# Patient Record
Sex: Female | Born: 1955
Health system: Southern US, Community
[De-identification: ages and names within clinical notes are randomized; demographics above are authoritative.]

## PROBLEM LIST (undated history)

## (undated) DIAGNOSIS — Z8619 Personal history of other infectious and parasitic diseases: Secondary | ICD-10-CM

## (undated) DIAGNOSIS — Z87448 Personal history of other diseases of urinary system: Secondary | ICD-10-CM

## (undated) DIAGNOSIS — F419 Anxiety disorder, unspecified: Secondary | ICD-10-CM

## (undated) DIAGNOSIS — M8580 Other specified disorders of bone density and structure, unspecified site: Secondary | ICD-10-CM

## (undated) DIAGNOSIS — I10 Essential (primary) hypertension: Secondary | ICD-10-CM

## (undated) HISTORY — PX: COLONOSCOPY: SHX174

## (undated) HISTORY — PX: WISDOM TOOTH EXTRACTION: SHX21

## (undated) HISTORY — DX: Essential (primary) hypertension: I10

## (undated) HISTORY — DX: Personal history of other diseases of urinary system: Z87.448

---
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
## 1999-02-01 ENCOUNTER — Encounter: Payer: Self-pay | Admitting: Orthopedic Surgery

## 1999-02-01 ENCOUNTER — Ambulatory Visit (HOSPITAL_COMMUNITY): Admission: RE | Admit: 1999-02-01 | Discharge: 1999-02-01 | Payer: Self-pay | Admitting: Orthopedic Surgery

## 1999-02-11 ENCOUNTER — Ambulatory Visit (HOSPITAL_COMMUNITY): Admission: RE | Admit: 1999-02-11 | Discharge: 1999-02-11 | Payer: Self-pay | Admitting: Orthopedic Surgery

## 1999-02-11 ENCOUNTER — Encounter: Payer: Self-pay | Admitting: Orthopedic Surgery

## 1999-10-14 ENCOUNTER — Encounter: Payer: Self-pay | Admitting: Obstetrics and Gynecology

## 1999-10-14 ENCOUNTER — Encounter: Admission: RE | Admit: 1999-10-14 | Discharge: 1999-10-14 | Payer: Self-pay | Admitting: Obstetrics and Gynecology

## 2000-10-22 ENCOUNTER — Encounter: Admission: RE | Admit: 2000-10-22 | Discharge: 2000-10-22 | Payer: Self-pay | Admitting: Obstetrics and Gynecology

## 2000-10-22 ENCOUNTER — Encounter: Payer: Self-pay | Admitting: Obstetrics and Gynecology

## 2001-10-28 ENCOUNTER — Encounter: Payer: Self-pay | Admitting: Obstetrics and Gynecology

## 2001-10-28 ENCOUNTER — Encounter: Admission: RE | Admit: 2001-10-28 | Discharge: 2001-10-28 | Payer: Self-pay | Admitting: Obstetrics and Gynecology

## 2002-11-03 ENCOUNTER — Encounter: Admission: RE | Admit: 2002-11-03 | Discharge: 2002-11-03 | Payer: Self-pay | Admitting: Obstetrics and Gynecology

## 2002-11-03 ENCOUNTER — Encounter: Payer: Self-pay | Admitting: Obstetrics and Gynecology

## 2003-05-21 ENCOUNTER — Encounter: Payer: Self-pay | Admitting: Internal Medicine

## 2003-05-21 ENCOUNTER — Ambulatory Visit (HOSPITAL_COMMUNITY): Admission: RE | Admit: 2003-05-21 | Discharge: 2003-05-21 | Payer: Self-pay | Admitting: Internal Medicine

## 2003-11-17 ENCOUNTER — Ambulatory Visit (HOSPITAL_COMMUNITY): Admission: RE | Admit: 2003-11-17 | Discharge: 2003-11-17 | Payer: Self-pay | Admitting: Obstetrics and Gynecology

## 2004-12-06 ENCOUNTER — Ambulatory Visit (HOSPITAL_COMMUNITY): Admission: RE | Admit: 2004-12-06 | Discharge: 2004-12-06 | Payer: Self-pay | Admitting: Obstetrics and Gynecology

## 2005-12-18 ENCOUNTER — Ambulatory Visit (HOSPITAL_COMMUNITY): Admission: RE | Admit: 2005-12-18 | Discharge: 2005-12-18 | Payer: Self-pay | Admitting: Obstetrics and Gynecology

## 2005-12-27 ENCOUNTER — Encounter: Admission: RE | Admit: 2005-12-27 | Discharge: 2005-12-27 | Payer: Self-pay | Admitting: Obstetrics and Gynecology

## 2007-01-02 ENCOUNTER — Ambulatory Visit (HOSPITAL_COMMUNITY): Admission: RE | Admit: 2007-01-02 | Discharge: 2007-01-02 | Payer: Self-pay | Admitting: Obstetrics and Gynecology

## 2008-01-07 ENCOUNTER — Ambulatory Visit (HOSPITAL_COMMUNITY): Admission: RE | Admit: 2008-01-07 | Discharge: 2008-01-07 | Payer: Self-pay | Admitting: Obstetrics and Gynecology

## 2008-05-08 ENCOUNTER — Ambulatory Visit: Payer: Self-pay | Admitting: Internal Medicine

## 2008-05-20 ENCOUNTER — Encounter: Payer: Self-pay | Admitting: Internal Medicine

## 2008-05-20 ENCOUNTER — Ambulatory Visit: Payer: Self-pay | Admitting: Internal Medicine

## 2008-05-21 ENCOUNTER — Encounter: Payer: Self-pay | Admitting: Internal Medicine

## 2009-01-12 ENCOUNTER — Ambulatory Visit (HOSPITAL_COMMUNITY): Admission: RE | Admit: 2009-01-12 | Discharge: 2009-01-12 | Payer: Self-pay | Admitting: Obstetrics & Gynecology

## 2010-01-17 ENCOUNTER — Encounter: Admission: RE | Admit: 2010-01-17 | Discharge: 2010-01-17 | Payer: Self-pay | Admitting: Obstetrics & Gynecology

## 2010-10-09 ENCOUNTER — Encounter: Payer: Self-pay | Admitting: Obstetrics and Gynecology

## 2010-12-13 ENCOUNTER — Other Ambulatory Visit: Payer: Self-pay | Admitting: Dermatology

## 2010-12-27 ENCOUNTER — Other Ambulatory Visit (HOSPITAL_COMMUNITY): Payer: Self-pay | Admitting: Obstetrics and Gynecology

## 2010-12-27 DIAGNOSIS — Z1231 Encounter for screening mammogram for malignant neoplasm of breast: Secondary | ICD-10-CM

## 2011-01-19 ENCOUNTER — Ambulatory Visit (HOSPITAL_COMMUNITY)
Admission: RE | Admit: 2011-01-19 | Discharge: 2011-01-19 | Disposition: A | Discharge status: Home / Self Care | Payer: 59 | Source: Ambulatory Visit | Attending: Obstetrics and Gynecology | Admitting: Obstetrics and Gynecology

## 2011-01-19 DIAGNOSIS — Z1231 Encounter for screening mammogram for malignant neoplasm of breast: Secondary | ICD-10-CM | POA: Insufficient documentation

## 2011-12-25 ENCOUNTER — Other Ambulatory Visit: Payer: Self-pay | Admitting: Obstetrics and Gynecology

## 2011-12-25 DIAGNOSIS — Z1231 Encounter for screening mammogram for malignant neoplasm of breast: Secondary | ICD-10-CM

## 2012-01-22 ENCOUNTER — Ambulatory Visit
Admission: RE | Admit: 2012-01-22 | Discharge: 2012-01-22 | Disposition: A | Discharge status: Home / Self Care | Payer: 59 | Source: Ambulatory Visit | Attending: Obstetrics and Gynecology | Admitting: Obstetrics and Gynecology

## 2012-01-22 DIAGNOSIS — Z1231 Encounter for screening mammogram for malignant neoplasm of breast: Secondary | ICD-10-CM

## 2012-01-25 ENCOUNTER — Other Ambulatory Visit: Payer: Self-pay | Admitting: Obstetrics and Gynecology

## 2012-01-25 DIAGNOSIS — R928 Other abnormal and inconclusive findings on diagnostic imaging of breast: Secondary | ICD-10-CM

## 2012-01-30 ENCOUNTER — Ambulatory Visit
Admission: RE | Admit: 2012-01-30 | Discharge: 2012-01-30 | Disposition: A | Discharge status: Home / Self Care | Payer: 59 | Source: Ambulatory Visit | Attending: Obstetrics and Gynecology | Admitting: Obstetrics and Gynecology

## 2012-01-30 DIAGNOSIS — R928 Other abnormal and inconclusive findings on diagnostic imaging of breast: Secondary | ICD-10-CM

## 2012-07-01 ENCOUNTER — Other Ambulatory Visit: Payer: Self-pay | Admitting: Obstetrics and Gynecology

## 2012-07-01 DIAGNOSIS — R922 Inconclusive mammogram: Secondary | ICD-10-CM

## 2012-07-02 ENCOUNTER — Ambulatory Visit
Admission: RE | Admit: 2012-07-02 | Discharge: 2012-07-02 | Disposition: A | Discharge status: Home / Self Care | Payer: 59 | Source: Ambulatory Visit | Attending: Obstetrics and Gynecology | Admitting: Obstetrics and Gynecology

## 2012-07-02 DIAGNOSIS — R922 Inconclusive mammogram: Secondary | ICD-10-CM

## 2012-07-02 DIAGNOSIS — R9230 Dense breasts, unspecified: Secondary | ICD-10-CM

## 2012-07-03 ENCOUNTER — Other Ambulatory Visit: Payer: 59

## 2012-07-04 ENCOUNTER — Other Ambulatory Visit: Payer: 59

## 2012-12-27 ENCOUNTER — Other Ambulatory Visit: Payer: Self-pay

## 2012-12-27 DIAGNOSIS — Z1231 Encounter for screening mammogram for malignant neoplasm of breast: Secondary | ICD-10-CM

## 2013-03-03 ENCOUNTER — Ambulatory Visit
Admission: RE | Admit: 2013-03-03 | Discharge: 2013-03-03 | Disposition: A | Discharge status: Home / Self Care | Payer: Self-pay | Source: Ambulatory Visit

## 2013-03-03 DIAGNOSIS — Z1231 Encounter for screening mammogram for malignant neoplasm of breast: Secondary | ICD-10-CM

## 2013-03-04 ENCOUNTER — Other Ambulatory Visit: Payer: Self-pay | Admitting: Obstetrics and Gynecology

## 2013-03-04 DIAGNOSIS — R928 Other abnormal and inconclusive findings on diagnostic imaging of breast: Secondary | ICD-10-CM

## 2013-03-14 ENCOUNTER — Ambulatory Visit
Admission: RE | Admit: 2013-03-14 | Discharge: 2013-03-14 | Disposition: A | Discharge status: Home / Self Care | Payer: 59 | Source: Ambulatory Visit | Attending: Obstetrics and Gynecology | Admitting: Obstetrics and Gynecology

## 2013-03-14 DIAGNOSIS — R928 Other abnormal and inconclusive findings on diagnostic imaging of breast: Secondary | ICD-10-CM

## 2013-05-20 ENCOUNTER — Encounter: Payer: Self-pay | Admitting: Internal Medicine

## 2013-07-10 ENCOUNTER — Encounter: Payer: Self-pay | Admitting: Internal Medicine

## 2013-07-16 ENCOUNTER — Ambulatory Visit (INDEPENDENT_AMBULATORY_CARE_PROVIDER_SITE_OTHER): Payer: 59 | Admitting: Obstetrics and Gynecology

## 2013-07-16 ENCOUNTER — Encounter: Payer: Self-pay | Admitting: Obstetrics and Gynecology

## 2013-07-16 VITALS — BP 160/80 | HR 76 | Ht 66.0 in | Wt 207.0 lb

## 2013-07-16 DIAGNOSIS — Z0000 Encounter for general adult medical examination without abnormal findings: Secondary | ICD-10-CM

## 2013-07-16 DIAGNOSIS — Z01419 Encounter for gynecological examination (general) (routine) without abnormal findings: Secondary | ICD-10-CM

## 2013-07-16 LAB — POCT URINALYSIS DIPSTICK: pH, UA: 6

## 2013-07-16 NOTE — Progress Notes (Signed)
Patient ID: Sarah Rangel, female   DOB: 27-Jun-1956, 57 y.o.   MRN: [id number] GYNECOLOGY VISIT  PCP:   None  Referring provider:   HPI: 57 y.o.   Married  Caucasian  female   G2P2002 with Patient's last menstrual period was 09/18/2008.   here for   AEX. Some hot flashes.  Did not take hormone therapy.  Has prolapse of pelvic organs, notes this with activity.  No urinary incontinence. Able to exercise and no leakage.  No perianal splinting or perineal splinting.  No constipation. No leakage of stool.  HgbA1C in July 5.9.   Hgb:    11.4 through [facility] 03-24-13 Urine:   Trace glucose.     GYNECOLOGIC HISTORY: Patient's last menstrual period was 09/18/2008. Sexually active:  yes Partner preference: female Contraception: postmenopausal   Menopausal hormone therapy: n/a DES exposure:  no  Blood transfusions:  no  Sexually transmitted diseases: no   GYN Procedures:  none Mammogram:    03-04-13 [facility] and has extra views on 03-14-13 which revealed 2 small nodules in upper central aspect of right breast--favor benign intramammary lymph nodes.  Follow-up of right breast by mammogram in 08/2013 to assess stability.              Pap:    06/2012 wnl:Neg HR HPV History of abnormal pap smear:  History of ASCUS paps but HPV always negative.  Last three paps were normal. (Oct. 2013, Oct. 2012, March 2013 - all normal)   Pap August 2010 and pap Sept 2011 - both ASCUS and negative high risk HPV.  Otherwise no abnormal paps, no colposcopy, and no treatment.    OB History   Grav Para Term Preterm Abortions TAB SAB Ect Mult Living   2 2 2       2        LIFESTYLE: Exercise:    Swimming and spin class          Tobacco:    no Alcohol:      2 drinks per week Drug use:   no  OTHER HEALTH MAINTENANCE: Tetanus/TDap:   Up to date with [facility] Gardisil:  NA Influenza:   06/2013 Zostavax:  No.  Bone density:  2011 WUJ:[id number] for Women Colonoscopy:  2009 wnl: Dr.  Marina Goodell.  Next colonoscopy if schedule 08-19-13  Cholesterol check: total 178, HDL 43, LDL 104 - July 2014.   Family History  Problem Relation Age of Onset  . Hypertension Father     There are no active problems to display for this patient.  History reviewed. No pertinent past medical history.  History reviewed. No pertinent past surgical history.  ALLERGIES: Review of patient's allergies indicates no known allergies.  No current outpatient prescriptions on file.   No current facility-administered medications for this visit.     ROS:  Pertinent items are noted in HPI.  SOCIAL HISTORY:  RN at [employer].   PHYSICAL EXAMINATION:    BP 160/80  Pulse 76  Ht 5\' 6"  (1.676 m)  Wt 207 lb (93.895 kg)  BMI 33.43 kg/m2  LMP 09/18/2008   Wt Readings from Last 3 Encounters:  07/16/13 207 lb (93.895 kg)     Ht Readings from Last 3 Encounters:  07/16/13 5\' 6"  (1.676 m)    General appearance: alert, cooperative and appears stated age Head: Normocephalic, without obvious abnormality, atraumatic Neck: no adenopathy, supple, symmetrical, trachea midline and thyroid not enlarged, symmetric, no tenderness/mass/nodules Lungs: clear  to auscultation bilaterally Breasts: Inspection negative, No nipple retraction or dimpling, No nipple discharge or bleeding, No axillary or supraclavicular adenopathy, Normal to palpation without dominant masses Heart: regular rate and rhythm Abdomen: soft, non-tender; no masses,  no organomegaly Extremities: extremities normal, atraumatic, no cyanosis or edema Skin: Skin color, texture, turgor normal. No rashes or lesions Lymph nodes: Cervical, supraclavicular, and axillary nodes normal. No abnormal inguinal nodes palpated Neurologic: Grossly normal  Pelvic: External genitalia:  no lesions              Urethra:  normal appearing urethra with no masses, tenderness or lesions              Bartholins and Skenes: normal                 Vagina: normal  appearing vagina with normal color and discharge, no lesions.  Third degree cystocele, first to second degree uterine prolapse, first degree rectocele.               Cervix: normal appearance              Pap and high risk HPV testing done: no.            Bimanual Exam:  Uterus:  uterus is normal size, shape, consistency and nontender                                      Adnexa: normal adnexa in size, nontender and no masses                                      Rectovaginal: Confirms                                      Anus:  normal sphincter tone, no lesions  ASSESSMENT  Normal gynecologic exam. Incomplete uterovaginal prolapse. History of ASCUS paps and negative HR HPV studies.  Last pap one year ago normal and negative HR HPV.   PLAN  Mammogram diagnostic right - patient will do in January.   No pap taken.  Patient and I discussed the new pap guidelines.  Next pap in two more years is a reasonable plan.  Long discussion regarding prolapse and incontinence, etiologies, and treatment options, including pessary and surgery.  ACOG materials on pelvic organ prolapse to patient.  Patient is inclined to do observational management at this time.  Return annually or prn   An After Visit Summary was printed and given to the patient.

## 2013-07-16 NOTE — Patient Instructions (Signed)

## 2013-08-12 ENCOUNTER — Ambulatory Visit (AMBULATORY_SURGERY_CENTER): Payer: Self-pay

## 2013-08-12 VITALS — Ht 67.0 in | Wt 208.4 lb

## 2013-08-12 DIAGNOSIS — Z86010 Personal history of colonic polyps: Secondary | ICD-10-CM

## 2013-08-12 MED ORDER — MOVIPREP 100 G PO SOLR: ORAL | Status: DC

## 2013-08-19 ENCOUNTER — Encounter: Payer: 59 | Admitting: Internal Medicine

## 2013-08-22 ENCOUNTER — Other Ambulatory Visit: Payer: Self-pay | Admitting: Obstetrics and Gynecology

## 2013-08-22 DIAGNOSIS — N63 Unspecified lump in unspecified breast: Secondary | ICD-10-CM

## 2013-09-02 ENCOUNTER — Encounter: Payer: Self-pay | Admitting: Internal Medicine

## 2013-09-18 DIAGNOSIS — Z8619 Personal history of other infectious and parasitic diseases: Secondary | ICD-10-CM

## 2013-09-18 HISTORY — DX: Personal history of other infectious and parasitic diseases: Z86.19

## 2013-09-22 ENCOUNTER — Ambulatory Visit
Admission: RE | Admit: 2013-09-22 | Discharge: 2013-09-22 | Disposition: A | Discharge status: Home / Self Care | Payer: 59 | Source: Ambulatory Visit | Attending: Obstetrics and Gynecology | Admitting: Obstetrics and Gynecology

## 2013-09-22 DIAGNOSIS — N63 Unspecified lump in unspecified breast: Secondary | ICD-10-CM

## 2013-10-21 ENCOUNTER — Ambulatory Visit (AMBULATORY_SURGERY_CENTER): Payer: Self-pay

## 2013-10-21 VITALS — Ht 67.0 in | Wt 200.0 lb

## 2013-10-21 DIAGNOSIS — Z860100 Personal history of colon polyps, unspecified: Secondary | ICD-10-CM

## 2013-10-21 MED ORDER — MOVIPREP 100 G PO SOLR: 1.0000 | Freq: Once | ORAL | Status: DC

## 2013-10-30 ENCOUNTER — Ambulatory Visit (AMBULATORY_SURGERY_CENTER): Payer: 59 | Admitting: Internal Medicine

## 2013-10-30 ENCOUNTER — Encounter: Payer: Self-pay | Admitting: Internal Medicine

## 2013-10-30 VITALS — BP 139/72 | HR 51 | Temp 97.1°F | Resp 41 | Ht 67.0 in | Wt 200.0 lb

## 2013-10-30 DIAGNOSIS — Z86010 Personal history of colonic polyps: Secondary | ICD-10-CM

## 2013-10-30 MED ORDER — SODIUM CHLORIDE 0.9 % IV SOLN: 500.0000 mL | INTRAVENOUS | Status: DC

## 2013-10-30 NOTE — Patient Instructions (Signed)
Impressions/recommendations:  Diverticulosis (handout given) High Fiber diet (handout given)  Repeat colonoscopy in 10 years.  YOU HAD AN ENDOSCOPIC PROCEDURE TODAY AT THE [facility]: Refer to the procedure report that was given to you for any specific questions about what was found during the examination.  If the procedure report does not answer your questions, please call your gastroenterologist to clarify.  If you requested that your care partner not be given the details of your procedure findings, then the procedure report has been included in a sealed envelope for you to review at your convenience later.  YOU SHOULD EXPECT: Some feelings of bloating in the abdomen. Passage of more gas than usual.  Walking can help get rid of the air that was put into your GI tract during the procedure and reduce the bloating. If you had a lower endoscopy (such as a colonoscopy or flexible sigmoidoscopy) you may notice spotting of blood in your stool or on the toilet paper. If you underwent a bowel prep for your procedure, then you may not have a normal bowel movement for a few days.  DIET: Your first meal following the procedure should be a light meal and then it is ok to progress to your normal diet.  A half-sandwich or bowl of soup is an example of a good first meal.  Heavy or fried foods are harder to digest and may make you feel nauseous or bloated.  Likewise meals heavy in dairy and vegetables can cause extra gas to form and this can also increase the bloating.  Drink plenty of fluids but you should avoid alcoholic beverages for 24 hours.  ACTIVITY: Your care partner should take you home directly after the procedure.  You should plan to take it easy, moving slowly for the rest of the day.  You can resume normal activity the day after the procedure however you should NOT DRIVE or use heavy machinery for 24 hours (because of the sedation medicines used during the test).    SYMPTOMS TO REPORT  IMMEDIATELY: A gastroenterologist can be reached at any hour.  During normal business hours, 8:30 AM to 5:00 PM Monday through Friday, call [phone].  After hours and on weekends, please call the GI answering service at [phone] who will take a message and have the physician on call contact you.   Following lower endoscopy (colonoscopy or flexible sigmoidoscopy):  Excessive amounts of blood in the stool  Significant tenderness or worsening of abdominal pains  Swelling of the abdomen that is new, acute  Fever of 100F or higher  FOLLOW UP: If any biopsies were taken you will be contacted by phone or by letter within the next 1-3 weeks.  Call your gastroenterologist if you have not heard about the biopsies in 3 weeks.  Our staff will call the home number listed on your records the next business day following your procedure to check on you and address any questions or concerns that you may have at that time regarding the information given to you following your procedure. This is a courtesy call and so if there is no answer at the home number and we have not heard from you through the emergency physician on call, we will assume that you have returned to your regular daily activities without incident.  SIGNATURES/CONFIDENTIALITY: You and/or your care partner have signed paperwork which will be entered into your electronic medical record.  These signatures attest to the fact that that the information above on your   your After Visit Summary has been reviewed and is understood.  Full responsibility of the confidentiality of this discharge information lies with you and/or your care-partner. 

## 2013-10-30 NOTE — Progress Notes (Signed)
Lidocaine-40mg IV prior to Propofol InductionPropofol given over incremental dosages 

## 2013-10-30 NOTE — Op Note (Signed)
[place]  [address] [place], [id number]   COLONOSCOPY PROCEDURE REPORT  PATIENT: Sarah, Rangel  MR#: [id number] BIRTHDATE: 12-Apr-1956 , 48  yrs. old GENDER: Female ENDOSCOPIST: Eustace Quail, MD REFERRED [id number] Program Recall PROCEDURE DATE:  10/30/2013 PROCEDURE:   Colonoscopy, surveillance First Screening Colonoscopy - Avg.  risk and is 50 yrs.  old or older - No.  Prior Negative Screening - Now for repeat screening. N/A  History of Adenoma - Now for follow-up colonoscopy & has been > or = to 3 yrs.  Yes hx of adenoma.  Has been 3 or more years since last colonoscopy.  Polyps Removed Today? No.  Recommend repeat exam, <10 yrs? No. ASA CLASS:   Class II INDICATIONS:Patient's personal history of adenomatous colon polyps. Index exam 05-2008 w/ Small TA MEDICATIONS: MAC sedation, administered by CRNA and propofol (Diprivan) 300mg  IV  DESCRIPTION OF PROCEDURE:   After the risks benefits and alternatives of the procedure were thoroughly explained, informed consent was obtained.  A digital rectal exam revealed no abnormalities of the rectum.   The LB [id number]  endoscope was introduced through the anus and advanced to the cecum, which was identified by both the appendix and ileocecal valve. No adverse events experienced.   The quality of the prep was excellent, using MoviPrep  The instrument was then slowly withdrawn as the colon was fully examined.      COLON FINDINGS: Mild diverticulosis was noted in the sigmoid colon. The colon was otherwise normal.  There was no diverticulosis, inflammation, polyps or cancers unless previously stated. Retroflexed views revealed no abnormalities. The time to cecum=3 minutes 32 seconds.  Withdrawal time=9 minutes 56 seconds.  The scope was withdrawn and the procedure completed.  COMPLICATIONS: There were no complications.  ENDOSCOPIC IMPRESSION: 1.   Mild diverticulosis was noted in the sigmoid  colon 2.   The colon was otherwise normal  RECOMMENDATIONS: 1. Continue current colorectal screening recommendations for "routine risk" patients with a repeat colonoscopy in 10 years.   eSigned:  Eustace Quail, MD 10/30/2013 8:53 AM   cc: Josefa Half, MD and The Patient

## 2013-10-31 ENCOUNTER — Telehealth: Payer: Self-pay | Admitting: *Deleted

## 2013-10-31 NOTE — Telephone Encounter (Signed)
Number identifier, left message, follow-up  

## 2013-11-06 ENCOUNTER — Encounter: Payer: Self-pay | Admitting: Emergency Medicine

## 2013-11-06 ENCOUNTER — Ambulatory Visit (INDEPENDENT_AMBULATORY_CARE_PROVIDER_SITE_OTHER): Payer: 59 | Admitting: Emergency Medicine

## 2013-11-06 ENCOUNTER — Ambulatory Visit: Payer: 59

## 2013-11-06 VITALS — BP 188/98 | HR 57 | Temp 97.7°F | Resp 17 | Ht 66.5 in | Wt 202.0 lb

## 2013-11-06 DIAGNOSIS — I10 Essential (primary) hypertension: Secondary | ICD-10-CM | POA: Insufficient documentation

## 2013-11-06 DIAGNOSIS — E871 Hypo-osmolality and hyponatremia: Secondary | ICD-10-CM

## 2013-11-06 DIAGNOSIS — R7309 Other abnormal glucose: Secondary | ICD-10-CM

## 2013-11-06 DIAGNOSIS — F439 Reaction to severe stress, unspecified: Secondary | ICD-10-CM | POA: Insufficient documentation

## 2013-11-06 DIAGNOSIS — R739 Hyperglycemia, unspecified: Secondary | ICD-10-CM

## 2013-11-06 LAB — POCT CBC
GRANULOCYTE PERCENT: 75.9 % (ref 37–80)
HEMATOCRIT: 39.4 % (ref 37.7–47.9)
Hemoglobin: 13.1 g/dL (ref 12.2–16.2)
LYMPH, POC: 0.8 (ref 0.6–3.4)
MCH, POC: 31.8 pg — AB (ref 27–31.2)
MCHC: 33.2 g/dL (ref 31.8–35.4)
MCV: 95.7 fL (ref 80–97)
MID (CBC): 0.4 (ref 0–0.9)
MPV: 7.2 fL (ref 0–99.8)
POC Granulocyte: 4 (ref 2–6.9)
POC LYMPH PERCENT: 15.7 %L (ref 10–50)
POC MID %: 8.4 %M (ref 0–12)
Platelet Count, POC: 225 10*3/uL (ref 142–424)
RBC: 4.12 M/uL (ref 4.04–5.48)
RDW, POC: 13.7 %
WBC: 5.3 10*3/uL (ref 4.6–10.2)

## 2013-11-06 LAB — BASIC METABOLIC PANEL
BUN: 18 mg/dL (ref 6–23)
CO2: 27 mEq/L (ref 19–32)
CREATININE: 0.99 mg/dL (ref 0.50–1.10)
Calcium: 9.5 mg/dL (ref 8.4–10.5)
Chloride: 89 mEq/L — ABNORMAL LOW (ref 96–112)
GLUCOSE: 150 mg/dL — AB (ref 70–99)
POTASSIUM: 4.9 meq/L (ref 3.5–5.3)
Sodium: 124 mEq/L — ABNORMAL LOW (ref 135–145)

## 2013-11-06 LAB — OSMOLALITY, URINE: OSMOLALITY UR: 162 mosm/kg — AB (ref 390–1090)

## 2013-11-06 LAB — LIPID PANEL
CHOLESTEROL: 188 mg/dL (ref 0–200)
HDL: 59 mg/dL (ref >39)
LDL CALC: 112 mg/dL — AB (ref 0–99)
Total CHOL/HDL Ratio: 3.2 Ratio
Triglycerides: 85 mg/dL (ref <150)
VLDL: 17 mg/dL (ref 0–40)

## 2013-11-06 LAB — POCT GLYCOSYLATED HEMOGLOBIN (HGB A1C): Hemoglobin A1C: 6

## 2013-11-06 LAB — TSH: TSH: 1.552 u[IU]/mL (ref 0.350–4.500)

## 2013-11-06 LAB — OSMOLALITY: Osmolality: 263 mOsm/kg — ABNORMAL LOW (ref 275–300)

## 2013-11-06 LAB — HIV ANTIBODY (ROUTINE TESTING W REFLEX): HIV: NONREACTIVE

## 2013-11-06 MED ORDER — LORAZEPAM 1 MG PO TABS: 1.0000 mg | ORAL_TABLET | Freq: Two times a day (BID) | ORAL | Status: DC | PRN

## 2013-11-06 MED ORDER — LISINOPRIL 20 MG PO TABS: 20.0000 mg | ORAL_TABLET | Freq: Every day | ORAL | Status: DC

## 2013-11-06 MED ORDER — HYDROCHLOROTHIAZIDE 12.5 MG PO TABS: 12.5000 mg | ORAL_TABLET | Freq: Every day | ORAL | Status: DC

## 2013-11-06 MED ORDER — METOPROLOL SUCCINATE ER 25 MG PO TB24: ORAL_TABLET | ORAL | Status: DC

## 2013-11-06 NOTE — Progress Notes (Addendum)
Subjective:    Patient ID: Sarah Rangel, female    DOB: [birth year], 58 y.o.   MRN: [id number] This chart was scribed for Arlyss Queen, MD by Rolanda Lundborg, ED Scribe. This patient was seen in room 5 and the patient's care was started at 11:18 AM.  Chief Complaint  Patient presents with  . Hypertension   HPI HPI Comments: RICKEYA MANUS is a 58 y.o. female with a h/o HTN who presents to the [facility] for a f/u on HTN. She states she checks her BP at home, and it normally runs in the 160-170 range but has been high over the last few months. She states she has tried reducing her salt and caffeine intake. She swims twice per week and goes to spin class once per week. She reports headache. She states she is anxious at baseline and thinks she may have white coat syndome. She denies CP, SOB. She had a health screening at age 28, all blood work was normal. TSH normal, BUN was 19, creatinine normal, potassium was 4.4.  PCP - No PCP Per Patient goes to OBGYN- Dr Quincy Simmonds  Past Medical History  Diagnosis Date  . History of cystocele   . Hypertension    No current outpatient prescriptions on file prior to visit.   No current facility-administered medications on file prior to visit.   No Known Allergies    Review of Systems  Constitutional: Negative for unexpected weight change.  Respiratory: Negative for shortness of breath.   Cardiovascular: Negative for chest pain.  Neurological: Positive for headaches.  Psychiatric/Behavioral: The patient is nervous/anxious.        Objective:   Physical Exam CONSTITUTIONAL: Well developed/well nourished. Face flushed.  HEAD: Normocephalic/atraumatic EYES: EOMI/PERRL ENMT: Mucous membranes moist NECK: supple no meningeal signs SPINE:entire spine nontender CV: s4 gallop, grade 1 cystolic murmur at the left systolic border. LUNGS: Lungs are clear to auscultation bilaterally, no apparent distress ABDOMEN: soft, nontender, no  rebound or guarding GU:no cva tenderness NEURO: Pt is awake/alert, moves all extremitiesx4 EXTREMITIES: pulses normal, full ROM SKIN: warm, color normal PSYCH: no abnormalities of mood noted   Filed Vitals:   11/06/13 1140  BP: 188/98  Pulse: 57  Temp: 97.7 F (36.5 C)  TempSrc: Oral  Resp: 17  Height: 5' 6.5" (1.689 m)  Weight: 202 lb (91.627 kg)  SpO2: 99%   Results for orders placed in visit on 11/06/13  POCT CBC      Result Value Ref Range   WBC 5.3  4.6 - 10.2 K/uL   Lymph, poc 0.8  0.6 - 3.4   POC LYMPH PERCENT 15.7  10 - 50 %L   MID (cbc) 0.4  0 - 0.9   POC MID % 8.4  0 - 12 %M   POC Granulocyte 4.0  2 - 6.9   Granulocyte percent 75.9  37 - 80 %G   RBC 4.12  4.04 - 5.48 M/uL   Hemoglobin 13.1  12.2 - 16.2 g/dL   HCT, POC 39.4  37.7 - 47.9 %   MCV 95.7  80 - 97 fL   MCH, POC 31.8 (*) 27 - 31.2 pg   MCHC 33.2  31.8 - 35.4 g/dL   RDW, POC 13.7     Platelet Count, POC 225  142 - 424 K/uL   MPV 7.2  0 - 99.8 fL  POCT GLYCOSYLATED HEMOGLOBIN (HGB A1C)      Result Value Ref Range  Hemoglobin A1C 6.0    EKG 1 PACs seen there are prominent T waves but recent potassium check a week ago was normal .     [facility] reading (PRIMARY) by  Dr. Everlene Farrier no acute disease   Assessment & Plan:  Treatment plan will be lisinopril 20, Toprol-XL 25 a half a day for a week to increase to one a day, HCTZ 12.5 a day, and the addition of Ativan 1 mg half to one twice a day to see if stress is contributing to her systolic hypertension. her Benicar was stopped. Her EKG and chest x-ray are reassuring. She is under a great deal of stress at work.  **Disclaimer: This note was dictated with voice recognition software. Similar sounding words can inadvertently be transcribed and this note may contain transcription errors which may not have been corrected upon publication of note.**  I personally performed the services described in this documentation, which was scribed in my presence. The recorded  information has been reviewed and is accurate.

## 2013-11-07 ENCOUNTER — Encounter: Payer: Self-pay | Admitting: Emergency Medicine

## 2013-11-07 MED ORDER — OLMESARTAN MEDOXOMIL 40 MG PO TABS: 40.0000 mg | ORAL_TABLET | Freq: Every day | ORAL | Status: DC

## 2013-11-08 ENCOUNTER — Encounter: Payer: Self-pay | Admitting: Emergency Medicine

## 2013-11-08 NOTE — Telephone Encounter (Signed)
Spoke with Dr. Everlene Farrier about pt's email asking for a shingles vaccine. He said it was ok to give it to her tomorrow, but that she needed to wait until we got her BP and low sodium under control before she got it, that she should wait a month or so. Printed rx and left it up front for her to grab tomorrow. LMOM on her VM to CB so that we could give her this info

## 2013-11-09 ENCOUNTER — Other Ambulatory Visit (INDEPENDENT_AMBULATORY_CARE_PROVIDER_SITE_OTHER): Payer: 59

## 2013-11-09 VITALS — BP 192/78

## 2013-11-09 DIAGNOSIS — E871 Hypo-osmolality and hyponatremia: Secondary | ICD-10-CM

## 2013-11-09 LAB — BASIC METABOLIC PANEL
BUN: 16 mg/dL (ref 6–23)
CALCIUM: 9 mg/dL (ref 8.4–10.5)
CO2: 23 mEq/L (ref 19–32)
Chloride: 100 mEq/L (ref 96–112)
Creat: 0.94 mg/dL (ref 0.50–1.10)
Glucose, Bld: 136 mg/dL — ABNORMAL HIGH (ref 70–99)
POTASSIUM: 4.8 meq/L (ref 3.5–5.3)
Sodium: 131 mEq/L — ABNORMAL LOW (ref 135–145)

## 2013-11-09 MED ORDER — ZOSTER VACCINE LIVE 19400 UNT/0.65ML ~~LOC~~ SOLR: 0.6500 mL | Freq: Once | SUBCUTANEOUS | Status: DC

## 2013-11-10 ENCOUNTER — Encounter: Payer: Self-pay | Admitting: Emergency Medicine

## 2013-11-12 ENCOUNTER — Encounter: Payer: Self-pay | Admitting: Emergency Medicine

## 2013-11-12 ENCOUNTER — Ambulatory Visit (INDEPENDENT_AMBULATORY_CARE_PROVIDER_SITE_OTHER): Payer: 59 | Admitting: Emergency Medicine

## 2013-11-12 VITALS — BP 156/82 | HR 67 | Temp 97.9°F | Resp 18 | Ht 66.0 in | Wt 199.0 lb

## 2013-11-12 DIAGNOSIS — I10 Essential (primary) hypertension: Secondary | ICD-10-CM

## 2013-11-12 DIAGNOSIS — E871 Hypo-osmolality and hyponatremia: Secondary | ICD-10-CM

## 2013-11-12 DIAGNOSIS — F411 Generalized anxiety disorder: Secondary | ICD-10-CM

## 2013-11-12 MED ORDER — NIFEDIPINE ER OSMOTIC RELEASE 30 MG PO TB24: 30.0000 mg | ORAL_TABLET | Freq: Every day | ORAL | Status: DC

## 2013-11-12 NOTE — Progress Notes (Addendum)
Subjective:    Patient ID: Sarah Rangel, female    DOB: Aug 23, 1956, 58 y.o.   MRN: [id number] This chart was scribed for Sarah Russian, MD by Sarah Rangel, ED Scribe. This patient was seen in room 03 and the patient's care was started at 9:14 AM.  Chief Complaint  Patient presents with  . Follow-up    htn    HPI Sarah Rangel is a 58 y.o. female Pt is here for HTN follow up, discussion.   She was prescribed Metoprolol 25 mg at last visit, but has been taking 12.5 mg due to sensitivity. She reports feeling like her BP was high 4 days ago. She started increasing frequency of 12.5 mg intake daily to reduce her BP, but states she thinks it went to low. She recently went back up to taking 25 mg a day. She reports feeling sluggish 3 days ago.   She reports being here today to inform of her increased dosage, and would like to reduce her BP even more. She states she has been trying to do everything she was told to keep her pressure down. She reports trying deep breathing and relaxation to reduce her BP. She reports Ativan has been working for her, 1/2 tablet 2x a day. She reports her HTN last Fall '14 coincides with more stress at work due to changes.   She has been trying to eat, drink healthy to keep her sodium levels down. She is fine with continuing HCTZ. She is going to have renal artery duplex done next week.   States she prefers Ellender Hose, MD PCP - No PCP Per Patient  Patient Active Problem List   Diagnosis Date Noted  . Unspecified essential hypertension 11/06/2013  . Stress 11/06/2013   Past Medical History  Diagnosis Date  . History of cystocele   . Hypertension    Past Surgical History  Procedure Laterality Date  . Wisdom tooth extraction      age 51   No Known Allergies Prior to Admission medications   Medication Sig Start Date End Date Taking? Authorizing Provider  hydrochlorothiazide (HYDRODIURIL) 25 MG tablet Take 25 mg by mouth daily.   Yes Historical  Provider, MD  LORazepam (ATIVAN) 1 MG tablet Take 1 tablet (1 mg total) by mouth 2 (two) times daily as needed for anxiety. 11/06/13  Yes Sarah Russian, MD  metoprolol succinate (TOPROL-XL) 25 MG 24 hr tablet Take 1/2 tablet qd for one week. Increase to 1 tablet qd. 11/06/13  Yes Sarah Russian, MD  olmesartan (BENICAR) 40 MG tablet Take 1 tablet (40 mg total) by mouth daily. 11/07/13  Yes Sarah Russian, MD  zoster vaccine live, PF, (ZOSTAVAX) 07680 UNT/0.65ML injection Inject 19,400 Units into the skin once. 11/09/13   Sarah Russian, MD   Review of Systems  Constitutional: Positive for activity change (sluggish). Negative for fever.  Cardiovascular: Negative for chest pain and leg swelling.  Neurological: Negative for dizziness, weakness and light-headedness.      Objective:   Physical Exam CONSTITUTIONAL: Well developed/well nourished HEAD: Normocephalic/atraumatic EYES: EOMI/PERRL ENMT: Mucous membranes moist NECK: supple no meningeal signs SPINE: entire spine nontender CV: S1/S2 noted, no murmurs/rubs/gallops noted LUNGS: Lungs CTAB, No apparent distress NEURO: Pt is awake/alert, moves all extremitiesx4 EXTREMITIES: FROM, no edema.  SKIN: warm, color normal PSYCH: no abnormalities of mood noted  BP 156/82  Pulse 67  Temp(Src) 97.9 F (36.6 C) (Oral)  Resp 18  Ht 5\' 6"  (1.676  m)  Wt 199 lb (90.266 kg)  BMI 32.13 kg/m2  SpO2 100%  LMP 09/18/2008  BP LEFT ARM:  210/86     Assessment & Plan:  Treatment plan will be Benicar 40 one a day HCTZ 25 one a day and Procardia XL 30 qd.   We'll check a Bmet  next week. Hopefully her sodium will not fall on the higher dose of HCTZ. She is also going to schedule a renal duplex on her own at work. I told her I would be happy to discuss her situation with the pharmacologist at her work. 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

## 2013-11-16 ENCOUNTER — Other Ambulatory Visit (INDEPENDENT_AMBULATORY_CARE_PROVIDER_SITE_OTHER): Payer: 59 | Admitting: Family Medicine

## 2013-11-16 DIAGNOSIS — E871 Hypo-osmolality and hyponatremia: Secondary | ICD-10-CM

## 2013-11-16 LAB — BASIC METABOLIC PANEL
BUN: 15 mg/dL (ref 6–23)
CALCIUM: 9.4 mg/dL (ref 8.4–10.5)
CO2: 26 mEq/L (ref 19–32)
Chloride: 95 mEq/L — ABNORMAL LOW (ref 96–112)
Creat: 1.03 mg/dL (ref 0.50–1.10)
Glucose, Bld: 135 mg/dL — ABNORMAL HIGH (ref 70–99)
POTASSIUM: 4.7 meq/L (ref 3.5–5.3)
SODIUM: 130 meq/L — AB (ref 135–145)

## 2013-11-16 NOTE — Progress Notes (Signed)
Patient here today for labs only. °

## 2013-11-17 ENCOUNTER — Encounter: Payer: Self-pay | Admitting: Emergency Medicine

## 2013-11-18 ENCOUNTER — Telehealth: Payer: Self-pay | Admitting: Family Medicine

## 2013-11-18 NOTE — Telephone Encounter (Signed)
Spoke with patient gave her labs results

## 2013-12-05 ENCOUNTER — Ambulatory Visit (INDEPENDENT_AMBULATORY_CARE_PROVIDER_SITE_OTHER): Payer: 59 | Admitting: Emergency Medicine

## 2013-12-05 VITALS — BP 180/76 | HR 76 | Temp 97.4°F | Resp 18 | Ht 67.5 in | Wt 197.0 lb

## 2013-12-05 DIAGNOSIS — R51 Headache: Secondary | ICD-10-CM

## 2013-12-05 DIAGNOSIS — R519 Headache, unspecified: Secondary | ICD-10-CM

## 2013-12-05 DIAGNOSIS — I10 Essential (primary) hypertension: Secondary | ICD-10-CM

## 2013-12-05 DIAGNOSIS — E875 Hyperkalemia: Secondary | ICD-10-CM

## 2013-12-05 LAB — COMPREHENSIVE METABOLIC PANEL
ALBUMIN: 5.1 g/dL (ref 3.5–5.2)
ALT: 21 U/L (ref 0–35)
AST: 18 U/L (ref 0–37)
Alkaline Phosphatase: 64 U/L (ref 39–117)
BUN: 14 mg/dL (ref 6–23)
CALCIUM: 9.5 mg/dL (ref 8.4–10.5)
CO2: 26 meq/L (ref 19–32)
Chloride: 97 mEq/L (ref 96–112)
Creat: 0.97 mg/dL (ref 0.50–1.10)
GLUCOSE: 130 mg/dL — AB (ref 70–99)
Potassium: 4.6 mEq/L (ref 3.5–5.3)
Sodium: 131 mEq/L — ABNORMAL LOW (ref 135–145)
TOTAL PROTEIN: 7.3 g/dL (ref 6.0–8.3)
Total Bilirubin: 0.4 mg/dL (ref 0.2–1.2)

## 2013-12-05 MED ORDER — NIFEDIPINE ER OSMOTIC RELEASE 30 MG PO TB24: 30.0000 mg | ORAL_TABLET | Freq: Two times a day (BID) | ORAL | Status: DC

## 2013-12-05 MED ORDER — LORAZEPAM 1 MG PO TABS: 1.0000 mg | ORAL_TABLET | Freq: Two times a day (BID) | ORAL | Status: DC | PRN

## 2013-12-05 NOTE — Patient Instructions (Signed)
We'll recheck 1 month. We'll schedule CT of the head because of her headache issue. She does have a family history of brain cancer.

## 2013-12-05 NOTE — Progress Notes (Signed)
   Subjective:    Patient ID: Sarah Rangel, female    DOB: [birth year], 58 y.o.   MRN: [id number]  HPI  58 year old female here for medication discussion and refill:  Patient states she is doing/feeling better in general Off HCTZ due to elevated sodium, no longer has leg cramps, off x 3 weeks  Started Procardia 30mg  in evening 9pm, tolerating ok. Also taking one around 6am Taking benicar in AM BP is staying around 140-160/70-80. Patient has been anxious about the possibility of medication side effects has had some headaches  Stressful work environment Taking ativan a half tablet three times a day  Review of Systems     Objective:   Physical Exam blood pressure for his repeat. Neck is supple chest was clear. Neurological cranial nerves II through XII are intact disc margins are sharp there are no hemorrhages or exudates deep tendon reflexes of the upper and lower extremities are 2+ motor strength 5 over5        Assessment & Plan:  I did refill her Ativan for one month refill she can take a half tablet 3 times a day. We'll schedule CT of the head because of her family history of brain cancer and recent onset of headaches. She will continue her Benicar.  Procardia twice a day patient does have significant whitecoat hypertension and anxiety.Marland Kitchen

## 2013-12-06 ENCOUNTER — Encounter: Payer: Self-pay | Admitting: Emergency Medicine

## 2013-12-18 ENCOUNTER — Ambulatory Visit (HOSPITAL_COMMUNITY)
Admission: RE | Admit: 2013-12-18 | Discharge: 2013-12-18 | Disposition: A | Discharge status: Home / Self Care | Payer: 59 | Source: Ambulatory Visit | Attending: Emergency Medicine | Admitting: Emergency Medicine

## 2013-12-18 DIAGNOSIS — R51 Headache: Secondary | ICD-10-CM

## 2014-01-05 ENCOUNTER — Other Ambulatory Visit: Payer: Self-pay | Admitting: Nurse Practitioner

## 2014-01-05 MED ORDER — FAMCICLOVIR 500 MG PO TABS: 500.0000 mg | ORAL_TABLET | Freq: Three times a day (TID) | ORAL | Status: DC

## 2014-01-05 NOTE — Progress Notes (Signed)
Seen here in the office today. Complaining of pain left side of her head.  Multiple blisters present. Looks like shingles.  Will give Famvir 500 mg TID for 7 days  Call the [place] office at [phone] if you have any questions, problems or concerns.   Burtis Junes, RN, [place] [address] [place], [place]  [id number] [phone]

## 2014-01-14 ENCOUNTER — Other Ambulatory Visit: Payer: Self-pay | Admitting: Emergency Medicine

## 2014-01-14 NOTE — Telephone Encounter (Signed)
Called pharm to check if they got the Rx sent in 12/05/13. They do not have that Rx on file. Gave them just 1 mos RF ( #45 w/no RFs) since that is all that would have been left on that Rx on phone w/new sig of 1/2 tab TID prn as instr'd on OV notes.

## 2014-02-18 ENCOUNTER — Other Ambulatory Visit: Payer: Self-pay | Admitting: Obstetrics and Gynecology

## 2014-02-18 DIAGNOSIS — Z1231 Encounter for screening mammogram for malignant neoplasm of breast: Secondary | ICD-10-CM

## 2014-02-18 DIAGNOSIS — N649 Disorder of breast, unspecified: Secondary | ICD-10-CM

## 2014-02-18 DIAGNOSIS — N63 Unspecified lump in unspecified breast: Secondary | ICD-10-CM

## 2014-03-18 ENCOUNTER — Encounter: Payer: Self-pay | Admitting: Obstetrics and Gynecology

## 2014-04-01 ENCOUNTER — Other Ambulatory Visit: Payer: Self-pay | Admitting: Obstetrics and Gynecology

## 2014-04-07 ENCOUNTER — Ambulatory Visit
Admission: RE | Admit: 2014-04-07 | Discharge: 2014-04-07 | Disposition: A | Discharge status: Home / Self Care | Payer: 59 | Source: Ambulatory Visit | Attending: Obstetrics and Gynecology | Admitting: Obstetrics and Gynecology

## 2014-04-07 DIAGNOSIS — Z1231 Encounter for screening mammogram for malignant neoplasm of breast: Secondary | ICD-10-CM

## 2014-04-07 DIAGNOSIS — N649 Disorder of breast, unspecified: Secondary | ICD-10-CM

## 2014-05-18 ENCOUNTER — Ambulatory Visit (INDEPENDENT_AMBULATORY_CARE_PROVIDER_SITE_OTHER): Payer: 59 | Admitting: Family Medicine

## 2014-05-18 VITALS — BP 184/76 | HR 87 | Temp 98.0°F | Resp 16 | Ht 66.0 in | Wt 201.4 lb

## 2014-05-18 DIAGNOSIS — B029 Zoster without complications: Secondary | ICD-10-CM

## 2014-05-18 LAB — POCT CBC
Granulocyte percent: 74.2 %G (ref 37–80)
HCT, POC: 41.3 % (ref 37.7–47.9)
Hemoglobin: 13.5 g/dL (ref 12.2–16.2)
Lymph, poc: 1.1 (ref 0.6–3.4)
MCH: 31.8 pg — AB (ref 27–31.2)
MCHC: 32.8 g/dL (ref 31.8–35.4)
MCV: 97 fL (ref 80–97)
MID (cbc): 0.2 (ref 0–0.9)
MPV: 6.2 fL (ref 0–99.8)
POC Granulocyte: 3.7 (ref 2–6.9)
POC LYMPH %: 21.3 % (ref 10–50)
POC MID %: 4.5 %M (ref 0–12)
Platelet Count, POC: 194 10*3/uL (ref 142–424)
RBC: 4.26 M/uL (ref 4.04–5.48)
RDW, POC: 12.6 %
WBC: 5 10*3/uL (ref 4.6–10.2)

## 2014-05-18 MED ORDER — VALACYCLOVIR HCL 1 G PO TABS: 1000.0000 mg | ORAL_TABLET | Freq: Three times a day (TID) | ORAL | Status: DC

## 2014-05-18 NOTE — Patient Instructions (Signed)

## 2014-05-18 NOTE — Progress Notes (Signed)
Subjective:  This chart was scribed for Delman Cheadle, MD by Randa Evens, ED Scribe. This Patient was seen in room 09 and the patients care was started at 7:20 PM   Patient ID: Sarah Rangel, female    DOB: [birth year], 58 y.o.   MRN: [id number]  Chief Complaint  Patient presents with  . Herpes Zoster    Left side of face/scalp    HPI HPI Comments: Sarah Rangel is a 58 y.o. female who presents to the [facility] complaining of rash on the left side of face and scalp onset today. She states she is having some associated eye itching and left sided facial pain. She states she has a Hx of shingles in April. She states that she received Valtrex and the shingles resolved after about 2 weeks. She states that the shingles where mostly located in her scalp back in April. She state she was started on the Valtrex in less than 72 hours. She state she has been having some lingering pain. She states that she is concerned that she may have shingles again. She denies eye pain or visual disturbance.   She states she wasn't able to initially get the shingles vaccine because she was under 60 and didn't want to pay the extra cost. She state she was going to wait until she turned 58 but wasn't sure about the timing due to already having shingles before she turned 58.    Past Medical History  Diagnosis Date  . History of cystocele   . Hypertension    Current Outpatient Prescriptions on File Prior to Visit  Medication Sig Dispense Refill  . LORazepam (ATIVAN) 1 MG tablet Take 1/2 tablet by mouth three times daily as needed for anxiety.  45 tablet  0  . NIFEdipine (PROCARDIA-XL/ADALAT-CC/NIFEDICAL-XL) 30 MG 24 hr tablet Take 1 tablet (30 mg total) by mouth 2 (two) times daily.  180 tablet  3  . olmesartan (BENICAR) 40 MG tablet Take 1 tablet (40 mg total) by mouth daily.  30 tablet  2  . zoster vaccine live, PF, (ZOSTAVAX) 06237 UNT/0.65ML injection Inject 19,400 Units into the skin once.   1 each  0   No current facility-administered medications on file prior to visit.   No Known Allergies  Review of Systems  Eyes: Positive for itching. Negative for pain and visual disturbance.  Skin: Positive for rash.   Objective:   BP 184/76  Pulse 87  Temp(Src) 98 F (36.7 C) (Oral)  Resp 16  Ht 5\' 6"  (1.676 m)  Wt 201 lb 6 oz (91.343 kg)  BMI 32.52 kg/m2  SpO2 99%  LMP 09/18/2008   Physical Exam  Nursing note and vitals reviewed. Constitutional: She is oriented to person, place, and time. She appears well-developed and well-nourished. No distress.  HENT:  Head: Normocephalic and atraumatic.  Eyes: Conjunctivae and EOM are normal.  Neck: Neck supple. No tracheal deviation present.  Cardiovascular: Normal rate.   Pulmonary/Chest: Effort normal. No respiratory distress.  Musculoskeletal: Normal range of motion.  Neurological: She is alert and oriented to person, place, and time.  Skin: Skin is warm and dry. Rash noted.  2 1cm erythematous raised lesions lateral to left eye and 1 distribution, 1 with small amount of central ulceration, no lesions in hair visible  Psychiatric: She has a normal mood and affect. Her behavior is normal.   3 gtts of properacaine to Lt eye followed by fluorscein stain - no ulceration or  dendrites seen w/ wood's lamp, eye cleansed with sterile saline Results for orders placed in visit on 05/18/14  POCT CBC      Result Value Ref Range   WBC 5.0  4.6 - 10.2 K/uL   Lymph, poc 1.1  0.6 - 3.4   POC LYMPH PERCENT 21.3  10 - 50 %L   MID (cbc) 0.2  0 - 0.9   POC MID % 4.5  0 - 12 %M   POC Granulocyte 3.7  2 - 6.9   Granulocyte percent 74.2  37 - 80 %G   RBC 4.26  4.04 - 5.48 M/uL   Hemoglobin 13.5  12.2 - 16.2 g/dL   HCT, POC 41.3  37.7 - 47.9 %   MCV 97.0  80 - 97 fL   MCH, POC 31.8 (*) 27 - 31.2 pg   MCHC 32.8  31.8 - 35.4 g/dL   RDW, POC 12.6     Platelet Count, POC 194  142 - 424 K/uL   MPV 6.2  0 - 99.8 fL    Assessment & Plan:    Herpes zoster - Plan: POCT CBC, HIV antibody - Rec stat referral to optho but pt declines and agrees to contact her opthalmologist  (Dr. Bufford Buttner) asap tomorrow and sched her f/u appt in 1-2d.  RTC immed if any eye pain or any other lesions dev.  2nd episode of shingles in same distribution as 4 mos prev so check labs to ensure no immunosuppression beyond stress.  Advised to get zostavax 1-2 mos after lesions resolved.  Meds ordered this encounter  Medications  . valACYclovir (VALTREX) 1000 MG tablet    Sig: Take 1 tablet (1,000 mg total) by mouth 3 (three) times daily.    Dispense:  21 tablet    Refill:  0    I personally performed the services described in this documentation, which was scribed in my presence. The recorded information has been reviewed and considered, and addended by me as needed.  Delman Cheadle, MD MPH

## 2014-05-19 ENCOUNTER — Encounter: Payer: Self-pay | Admitting: Family Medicine

## 2014-05-19 LAB — HIV ANTIBODY (ROUTINE TESTING W REFLEX): HIV 1&2 Ab, 4th Generation: NONREACTIVE

## 2014-05-21 ENCOUNTER — Encounter: Payer: Self-pay | Admitting: Family Medicine

## 2014-06-03 ENCOUNTER — Encounter: Payer: Self-pay | Admitting: Family Medicine

## 2014-07-12 ENCOUNTER — Encounter: Payer: Self-pay | Admitting: Obstetrics and Gynecology

## 2014-07-17 ENCOUNTER — Telehealth: Payer: Self-pay | Admitting: Obstetrics and Gynecology

## 2014-07-17 NOTE — Telephone Encounter (Signed)
Pt was speaking with dr Quincy Simmonds via Deloris Ping. Quincy Simmonds told pt to call the office to find out an estimate cost would be for a pessary.

## 2014-07-19 ENCOUNTER — Ambulatory Visit (INDEPENDENT_AMBULATORY_CARE_PROVIDER_SITE_OTHER): Payer: 59 | Admitting: Emergency Medicine

## 2014-07-19 VITALS — BP 180/74 | HR 78 | Temp 97.6°F | Resp 16 | Ht 66.0 in | Wt 203.0 lb

## 2014-07-19 DIAGNOSIS — T148 Other injury of unspecified body region: Secondary | ICD-10-CM

## 2014-07-19 DIAGNOSIS — W57XXXA Bitten or stung by nonvenomous insect and other nonvenomous arthropods, initial encounter: Secondary | ICD-10-CM

## 2014-07-19 DIAGNOSIS — I10 Essential (primary) hypertension: Secondary | ICD-10-CM

## 2014-07-19 MED ORDER — AMLODIPINE BESYLATE 5 MG PO TABS: ORAL_TABLET | ORAL | Status: DC

## 2014-07-19 NOTE — Progress Notes (Signed)
   Subjective:    Patient ID: Sarah Rangel, female    DOB: 05/30/1956, 58 y.o.   MRN: [id number]  HPI Chief Complaint  Patient presents with  . Insect Bite    r leg   This chart was scribed for Arlyss Queen, MD by Thea Alken, ED Scribe. This patient was seen in room 12 and the patient's care was started at 12:33 PM.  HPI Comments: ANASTASYA Rangel is a 58 y.o. female who presents to the [facility] complaining of an worsening itchy, erythematous, insect bite to right upper leg x 1 day ago. Pt states she was throwing away pine needles yesterday when she felt a sting to right leg. Pt was unable to identify insect but felt the sting. Pt reports area is erythematous and is worse from occurrence. She applied hydrocortisone cream last night and has taken benadryl last dose being this morning.   Pt is also requesting medication change. Pt states she would like to change BP medication from procardia to amlodipine. Pt states current medication, procardia, has been causing her to flush. Pt states she has taken amlodipine in the past and reports minimal swelling with this medication.    Past Medical History  Diagnosis Date  . History of cystocele   . Hypertension    No Known Allergies Prior to Admission medications   Medication Sig Start Date End Date Taking? Authorizing Provider  LORazepam (ATIVAN) 1 MG tablet Take 1/2 tablet by mouth three times daily as needed for anxiety. 01/14/14  Yes Darlyne Russian, MD  NIFEdipine (PROCARDIA-XL/ADALAT-CC/NIFEDICAL-XL) 30 MG 24 hr tablet Take 1 tablet (30 mg total) by mouth 2 (two) times daily. 12/05/13  Yes Darlyne Russian, MD  olmesartan (BENICAR) 40 MG tablet Take 1 tablet (40 mg total) by mouth daily. 11/07/13  Yes Darlyne Russian, MD  valACYclovir (VALTREX) 1000 MG tablet Take 1 tablet (1,000 mg total) by mouth 3 (three) times daily. 05/18/14   Shawnee Knapp, MD  zoster vaccine live, PF, (ZOSTAVAX) 42706 UNT/0.65ML injection Inject 19,400 Units  into the skin once. 11/09/13   Darlyne Russian, MD   Review of Systems  Constitutional: Negative for fever and chills.  Skin: Positive for color change. Negative for wound.   Objective:   Physical Exam CONSTITUTIONAL: Well developed/well nourished HEAD: Normocephalic/atraumatic EYES: EOMI/PERRL ENMT: Mucous membranes moist NECK: supple no meningeal signs SPINE:entire spine nontender CV: S1/S2 noted, no murmurs/rubs/gallops noted LUNGS: Lungs are clear to auscultation bilaterally, no apparent distress ABDOMEN: soft, nontender, no rebound or guarding GU:no cva tenderness NEURO: Pt is awake/alert, moves all extremitiesx4 EXTREMITIES: pulses normal, full ROM SKIN: warm, color normalthere is a 3 x 3" red indurated area over the anterior right thigh. PSYCH: no abnormalities of mood noted  Filed Vitals:   07/19/14 1109  BP: 180/74  Pulse: 78  Temp: 97.6 F (36.4 C)  Resp: 16   Assessment & Plan:  Blood pressure is elevated today will change to amlodipine 5 mg 1-2 tablets per day and she will monitor her blood pressure. She is going to stop the nifedipine. She will use ice and antihistamines for the insect bite on her thigh.  I personally performed the services described in this documentation, which was scribed in my presence. The recorded information has been reviewed and is accurate.

## 2014-07-21 NOTE — Telephone Encounter (Signed)
Would you like for me contact this patient to let her know what her benefits are for the pessary insertion?

## 2014-07-21 NOTE — Telephone Encounter (Signed)
Thank you Dr. Quincy Simmonds, I will do that.

## 2014-07-21 NOTE — Telephone Encounter (Signed)
Sarah Rangel,   It would be helpful for you to contact her to give her an estimate of the pessary and the pessary fitting.  With respect to surgical cost, this may be more difficult as I do not know what her procedure would be yet.   Thanks.

## 2014-08-19 ENCOUNTER — Encounter: Payer: Self-pay | Admitting: Obstetrics and Gynecology

## 2014-08-19 ENCOUNTER — Ambulatory Visit (INDEPENDENT_AMBULATORY_CARE_PROVIDER_SITE_OTHER): Payer: 59 | Admitting: Obstetrics and Gynecology

## 2014-08-19 VITALS — BP 142/82 | HR 76 | Resp 18 | Ht 66.0 in | Wt 205.0 lb

## 2014-08-19 DIAGNOSIS — Z0000 Encounter for general adult medical examination without abnormal findings: Secondary | ICD-10-CM

## 2014-08-19 DIAGNOSIS — Z01419 Encounter for gynecological examination (general) (routine) without abnormal findings: Secondary | ICD-10-CM

## 2014-08-19 DIAGNOSIS — N812 Incomplete uterovaginal prolapse: Secondary | ICD-10-CM

## 2014-08-19 LAB — POCT URINALYSIS DIPSTICK
Bilirubin, UA: NEGATIVE
GLUCOSE UA: NEGATIVE
Ketones, UA: NEGATIVE
Leukocytes, UA: NEGATIVE
Nitrite, UA: NEGATIVE
PH UA: 5
Protein, UA: NEGATIVE
RBC UA: NEGATIVE
UROBILINOGEN UA: NEGATIVE

## 2014-08-19 NOTE — Patient Instructions (Signed)

## 2014-08-19 NOTE — Progress Notes (Signed)
Patient ID: Sarah Rangel, female   DOB: 1956-09-05, 58 y.o.   MRN: [id number] 58 y.o. [id number] MarriedCaucasianF here for annual exam.    Had shingles in April 2015.  Received Zostavax vaccine after this.   Some increase in prolapse especially if standing or doing yard work.  Rare urinary leakage and urgency.   Elevated HgbA1C  PCP:  Arlyss Queen, MD  UA - neg.  Patient's last menstrual period was 09/18/2008 (approximate).          Sexually active: Yes.   female The current method of family planning is vasectomy.    Exercising: Yes.    swimming, spinning and walking. Smoker:  Former smoker.  Health Maintenance: Pap:  06/2012 wnl:neg HR HPV. History of abnormal Pap:  Yes, Hx of Ascus paps but HPV neg.  MMG: 04-07-14  Bi-Rads 2 IDP:OEUMP breasts: [facility]--stable likely benign intramammary lymph nodes in central right breast dating back to May 2013 Colonoscopy: 10-30-13 normal with Dr. Henrene Pastor.  Next colonoscopy due 10/2023.  BMD:   2011 NTI:[id number] for Women TDaP:  Up to date through [place]:  Hb today: PCP, Urine today: Neg   reports that she has quit smoking. Her smoking use included Cigarettes. She smoked 0.00 packs per day. She has never used smokeless tobacco. She reports that she drinks about 1.2 oz of alcohol per week. She reports that she does not use illicit drugs.  Past Medical History  Diagnosis Date  . History of cystocele   . Hypertension     Past Surgical History  Procedure Laterality Date  . Wisdom tooth extraction      age 65    Current Outpatient Prescriptions  Medication Sig Dispense Refill  . amLODipine (NORVASC) 5 MG tablet Take 1-2 tablets daily for blood pressure control. 180 tablet 3  . LORazepam (ATIVAN) 1 MG tablet Take 1/2 tablet by mouth three times daily as needed for anxiety. 45 tablet 0  . olmesartan (BENICAR) 40 MG tablet Take 1 tablet (40 mg total) by mouth daily. 30 tablet 2   No current facility-administered  medications for this visit.    Family History  Problem Relation Age of Onset  . Hypertension Father   . Colon cancer Neg Hx   . Pancreatic cancer Neg Hx   . Stomach cancer Neg Hx   . Brain cancer Mother   . Lymphoma Mother     ROS:  Pertinent items are noted in HPI.  Otherwise, a comprehensive ROS was negative.  Exam:   BP 142/82 mmHg  Pulse 76  Resp 18  Ht 5\' 6"  (1.676 m)  Wt 205 lb (92.987 kg)  BMI 33.10 kg/m2  LMP 09/18/2008 (Approximate)     Height: 5\' 6"  (167.6 cm)  Ht Readings from Last 3 Encounters:  08/19/14 5\' 6"  (1.676 m)  07/19/14 5\' 6"  (1.676 m)  05/18/14 5\' 6"  (1.676 m)    General appearance: alert, cooperative and appears stated age Head: Normocephalic, without obvious abnormality, atraumatic Neck: no adenopathy, supple, symmetrical, trachea midline and thyroid normal to inspection and palpation Lungs: clear to auscultation bilaterally Breasts: normal appearance, no masses or tenderness, Inspection negative, No nipple retraction or dimpling, No nipple discharge or bleeding, No axillary or supraclavicular adenopathy Heart: regular rate and rhythm Abdomen: soft, non-tender; bowel sounds normal; no masses,  no organomegaly Extremities: extremities normal, atraumatic, no cyanosis or edema Skin: Skin color, texture, turgor normal. No rashes or lesions Lymph nodes: Cervical, supraclavicular, and axillary nodes normal.  No abnormal inguinal nodes palpated Neurologic: Grossly normal   Pelvic: External genitalia:  no lesions              Urethra:  normal appearing urethra with no masses, tenderness or lesions              Bartholins and Skenes: normal                 Vagina: normal appearing vagina with normal color and discharge, no lesions.  Second degree cystocele, almost second degree uterine prolapse, minimal rectocele.               Cervix: no lesions              Pap taken: Yes.   Bimanual Exam:  Uterus:  normal size, contour, position, consistency,  mobility, non-tender              Adnexa: normal adnexa and no mass, fullness, tenderness               Rectovaginal: Confirms               Anus:  normal sphincter tone, no lesions  A:  Well Woman with normal exam Incomplete uterovaginal prolapse.   P:   Mammogram yearly.  pap smear and HR HPV testing.  20 minutes of additional time discussion pelvic organ prolapse and treatment options.  Over 50% was spent in counseling. Patient interested in pessary and possible future surgical care.  ACOG handouts on prolapse and incontinence and surgical care also.  Encouraged weight loss. Labs with PCP.  return annually or prn  An After Visit Summary was printed and given to the patient.

## 2014-08-25 LAB — IPS PAP TEST WITH HPV

## 2014-09-04 ENCOUNTER — Ambulatory Visit: Payer: 59 | Admitting: Obstetrics and Gynecology

## 2014-09-16 ENCOUNTER — Ambulatory Visit (INDEPENDENT_AMBULATORY_CARE_PROVIDER_SITE_OTHER): Payer: 59 | Admitting: Obstetrics and Gynecology

## 2014-09-16 ENCOUNTER — Encounter: Payer: Self-pay | Admitting: Obstetrics and Gynecology

## 2014-09-16 VITALS — BP 160/80 | HR 66 | Ht 66.0 in | Wt 205.2 lb

## 2014-09-16 DIAGNOSIS — N813 Complete uterovaginal prolapse: Secondary | ICD-10-CM

## 2014-09-16 MED ORDER — ESTRADIOL 0.1 MG/GM VA CREA: TOPICAL_CREAM | VAGINAL | Status: DC

## 2014-09-16 NOTE — Progress Notes (Signed)
Patient ID: Sarah Rangel, female   DOB: Jan 15, 1956, 58 y.o.   MRN: [id number] GYNECOLOGY  VISIT   HPI: 58 y.o.   Married  Caucasian  female   G2P2002 with Patient's last menstrual period was 09/18/2008 (approximate).   here for pessary fitting.    On recent routine pelvic exam, patient was noted to have a second degree cystocele, almost second degree uterine prolapse, minimal rectocele.  Feels like her prolapse has increased over the last year.   Likes to be active and swim.   Wants to loose weight.   Not ready to consider surgery at this point.   GYNECOLOGIC HISTORY: Patient's last menstrual period was 09/18/2008 (approximate). Contraception: vasectomy/postmenopausal   Menopausal hormone therapy: none        OB History    Gravida Para Term Preterm AB TAB SAB Ectopic Multiple Living   2 2 2       2          Patient Active Problem List   Diagnosis Date Noted  . Worsening headaches 12/05/2013  . Unspecified essential hypertension 11/06/2013  . Stress 11/06/2013    Past Medical History  Diagnosis Date  . History of cystocele   . Hypertension     Past Surgical History  Procedure Laterality Date  . Wisdom tooth extraction      age 58    Current Outpatient Prescriptions  Medication Sig Dispense Refill  . LORazepam (ATIVAN) 1 MG tablet Take 1/2 tablet by mouth three times daily as needed for anxiety. 45 tablet 0  . NIFEdipine (PROCARDIA-XL/ADALAT-CC/NIFEDICAL-XL) 30 MG 24 hr tablet Take 30 mg by mouth daily.    Marland Kitchen olmesartan (BENICAR) 40 MG tablet Take 1 tablet (40 mg total) by mouth daily. 30 tablet 2   No current facility-administered medications for this visit.     ALLERGIES: Review of patient's allergies indicates no known allergies.  Family History  Problem Relation Age of Onset  . Hypertension Father   . Colon cancer Neg Hx   . Pancreatic cancer Neg Hx   . Stomach cancer Neg Hx   . Brain cancer Mother   . Lymphoma Mother     History   Social History   . Marital Status: Married    Spouse Name: N/A    Number of Children: N/A  . Years of Education: N/A   Occupational History  . Not on file.   Social History Main Topics  . Smoking status: Former Smoker    Types: Cigarettes  . Smokeless tobacco: Never Used  . Alcohol Use: 1.2 oz/week    2 Not specified per week     Comment: occasionally 1 time monthly  . Drug Use: No  . Sexual Activity:    Partners: Male    Birth Control/ Protection: Other-see comments, Post-menopausal     Comment: vasectomy   Other Topics Concern  . Not on file   Social History Narrative    ROS:  Pertinent items are noted in HPI.  PHYSICAL EXAMINATION:    BP 160/80 mmHg  Pulse 66  Ht 5\' 6"  (1.676 m)  Wt 205 lb 3.2 oz (93.078 kg)  BMI 33.14 kg/m2  LMP 09/18/2008 (Approximate)     General appearance: alert, cooperative and appears stated age   Pelvic: External genitalia:  no lesions              Urethra:  normal appearing urethra with no masses, tenderness or lesions  Bartholins and Skenes: normal                 Vagina: normal appearing vagina with normal color and discharge, no lesions, sizeable third degree cystocele, second degree uterine prolapse, first degree rectocele.               Cervix: normal appearance                   Bimanual Exam:  Uterus:  uterus is normal size, shape, consistency and nontender                                      Adnexa: normal adnexa in size, nontender and no masses                                    ASSESSMENT  Complete uterovaginal prolapse. Progressive.  PLAN  Pessary ring with support #5 fitted.  Patient maneuvers tested and pessary stayed in place.  Will order pessary.  Estrace vaginal cream 1/2 gm per hs for 2 weeks and then twice weekly.  See orders.  Risks of DVT, PE, MI, stroke, breast CA discussed. Return when pessary arrives.      An After Visit Summary was printed and given to the patient.  __25____ minutes face to face time of  which over 50% was spent in counseling.

## 2014-09-21 ENCOUNTER — Telehealth: Payer: Self-pay | Admitting: *Deleted

## 2014-09-21 NOTE — Telephone Encounter (Signed)
LM for pt to call back. Patient needs OV for new pessary.

## 2014-09-22 NOTE — Telephone Encounter (Signed)
Appt scheduled 09/28/14 @1pm  left message with husband per DPR.

## 2014-09-28 ENCOUNTER — Encounter: Payer: Self-pay | Admitting: Obstetrics and Gynecology

## 2014-09-28 ENCOUNTER — Ambulatory Visit (INDEPENDENT_AMBULATORY_CARE_PROVIDER_SITE_OTHER): Payer: 59 | Admitting: Obstetrics and Gynecology

## 2014-09-28 VITALS — BP 148/74 | HR 72 | Resp 16 | Ht 66.0 in | Wt 201.0 lb

## 2014-09-28 DIAGNOSIS — N812 Incomplete uterovaginal prolapse: Secondary | ICD-10-CM

## 2014-09-28 NOTE — Progress Notes (Signed)
GYNECOLOGY  VISIT   HPI: 59 y.o.   Married  Caucasian  female   G2P2002 with Patient's last menstrual period was 09/18/2008 (approximate).   here for  Pessary insertion.  #5 ring with support.   GYNECOLOGIC HISTORY: Patient's last menstrual period was 09/18/2008 (approximate). Contraception:   Husband had a vasectomy Menopausal hormone therapy: pt not using estrace cream        OB History    Gravida Para Term Preterm AB TAB SAB Ectopic Multiple Living   2 2 2       2          Patient Active Problem List   Diagnosis Date Noted  . Worsening headaches 12/05/2013  . Unspecified essential hypertension 11/06/2013  . Stress 11/06/2013    Past Medical History  Diagnosis Date  . History of cystocele   . Hypertension     Past Surgical History  Procedure Laterality Date  . Wisdom tooth extraction      age 83    Current Outpatient Prescriptions  Medication Sig Dispense Refill  . NIFEdipine (PROCARDIA-XL/ADALAT-CC/NIFEDICAL-XL) 30 MG 24 hr tablet Take 30 mg by mouth daily. Takes 2 daily    . olmesartan (BENICAR) 40 MG tablet Take 1 tablet (40 mg total) by mouth daily. 30 tablet 2  . estradiol (ESTRACE) 0.1 MG/GM vaginal cream Use 1/2 g vaginally every night for the first 2 weeks, then use 1/2 g vaginally two times a week. (Patient not taking: Reported on 09/28/2014) 42.5 g 2  . LORazepam (ATIVAN) 1 MG tablet Take 1/2 tablet by mouth three times daily as needed for anxiety. (Patient not taking: Reported on 09/28/2014) 45 tablet 0   No current facility-administered medications for this visit.     ALLERGIES: Review of patient's allergies indicates no known allergies.  Family History  Problem Relation Age of Onset  . Hypertension Father   . Colon cancer Neg Hx   . Pancreatic cancer Neg Hx   . Stomach cancer Neg Hx   . Brain cancer Mother   . Lymphoma Mother     History   Social History  . Marital Status: Married    Spouse Name: N/A    Number of Children: N/A  . Years of  Education: N/A   Occupational History  . Not on file.   Social History Main Topics  . Smoking status: Former Smoker    Types: Cigarettes  . Smokeless tobacco: Never Used  . Alcohol Use: 1.2 oz/week    2 Not specified per week     Comment: occasionally 1 time monthly  . Drug Use: No  . Sexual Activity:    Partners: Male    Birth Control/ Protection: Other-see comments, Post-menopausal     Comment: vasectomy   Other Topics Concern  . Not on file   Social History Narrative    ROS:  Pertinent items are noted in HPI.  PHYSICAL EXAMINATION:    BP 148/74 mmHg  Pulse 72  Resp 16  Ht 5\' 6"  (1.676 m)  Wt 201 lb (91.173 kg)  BMI 32.46 kg/m2  LMP 09/18/2008 (Approximate)     General appearance: alert, cooperative and appears stated age                                    Lot number - [id number] Premier silicone ring pessary with support #5. Patient able to place and remove without difficulty.  Able  to do maneuvers with pessary in place.   ASSESSMENT  Incomplete uterovaginal prolapse.  Pessary successfully fitted.  PLAN  Discussed pessary usage and care.  Use Estrace cream 1/2 gm pv twice weekly.  OK to use KY jelly for insertion if not using Estrace.  Follow up for a recheck in 3 weeks or sooner prn.  Will void prior to leaving office now.   An After Visit Summary was printed and given to the patient.  __15____ minutes face to face time of which over 50% was spent in counseling.

## 2014-10-13 ENCOUNTER — Telehealth: Payer: Self-pay | Admitting: Obstetrics and Gynecology

## 2014-10-13 NOTE — Telephone Encounter (Signed)
Spoke with patient. Patient states that when she was walking on Sunday she noticed a spot of blood in her underwear. Patient states she has not had any further bleeding since. Denies any abdominal discomfort or urinary changes. Patient was fitted for a pessary on 09/28/2014 with Dr.Silva. Patient has not been wearing pessary for 2 weeks. "I just do not feel like it was fitting right." Patient has follow up appointment with Dr.Silva scheduled for 10/28/2014. Advised patient needs to be seen sooner for evaluation. Patient is agreeable. Offered appointment for Thursday with Regina Eck CNM but patient declines. Patient requesting Friday appointment only. Appointment scheduled for 1/29 at 1:45pm with Milford Cage, FNP. Patient is agreeable to date and time. Advised Dr.Miller will be in the office for evaluation if anything further is needed. Patient is agreeable.  Routing to Milford Cage, FNP as seeing patient Cc: Dr.Miller  Routing to provider for final review. Patient agreeable to disposition. Will close encounter

## 2014-10-13 NOTE — Telephone Encounter (Signed)
Pt says she is menopausal and saw a spot of blood in underwear after walking. Also wanted to note that she has not worn her pessary for the last 2 week because it's just not a good fit for her.

## 2014-10-16 ENCOUNTER — Encounter: Payer: Self-pay | Admitting: Nurse Practitioner

## 2014-10-16 ENCOUNTER — Ambulatory Visit (INDEPENDENT_AMBULATORY_CARE_PROVIDER_SITE_OTHER): Payer: 59 | Admitting: Nurse Practitioner

## 2014-10-16 VITALS — BP 180/80 | HR 90 | Wt 197.6 lb

## 2014-10-16 DIAGNOSIS — N812 Incomplete uterovaginal prolapse: Secondary | ICD-10-CM

## 2014-10-16 DIAGNOSIS — N950 Postmenopausal bleeding: Secondary | ICD-10-CM

## 2014-10-16 NOTE — Progress Notes (Signed)
Subjective:     Patient ID: Sarah Rangel, female   DOB: Aug 25, 1956, 59 y.o.   MRN: [id number]  HPI  This 59 yo WM Fe G2,P2 who is postmenopausal since 2010.  She also has incomplete uterovaginal prolapse.  She was seen here and fitted with a #5 ring pessary with support on 09/28/14.  She felt well when she left and really had very little notice that pessary was there.  By the evening she decided to remove because it didn't feel 'quite right'.  She then inserted the pessary again on 1/12 to go the grocery store - it became quite uncomfortable like it was pinching something. Later that day she wanted to take a walk with her husband and she wore tighter clothing to help with the prolapse. She since then has laid the pessary aside and has not tried it again.  This past Sunday went for another walk and because of the ice she was trying to avoid falling  - so may have walked a little different with a wider base.  She thinks her underwear by wearing tighter under layers may have irritated her.  She had also pushed the prolapse up manually before the walk. After return she found a streak of blood on her underwear.  She is now frightened that she may have something else wrong.  Denied urinary symptoms.  Has not been using vaginal estrogen cream.  Pessary has been out since 09/29/14.   Review of Systems  Constitutional: Negative for fever, chills, diaphoresis, appetite change and fatigue.  Respiratory: Negative.   Cardiovascular: Negative.   Gastrointestinal: Negative.   Genitourinary: Positive for vaginal bleeding. Negative for dysuria, urgency, hematuria, flank pain, decreased urine volume, vaginal discharge, enuresis, vaginal pain, menstrual problem and pelvic pain.  Musculoskeletal: Negative.   Skin: Negative.   Neurological: Negative.   Psychiatric/Behavioral: Negative.        Objective:   Physical Exam  Constitutional: She is oriented to person, place, and time. She appears well-developed and  well-nourished.  Pulmonary/Chest: Effort normal.  Abdominal: Soft. She exhibits no distension. There is no tenderness. There is no rebound and no guarding.  Genitourinary:  At first check the prolapse is out beyond the introitus.  The area of redness is all at the rim of the introitus and there is a small 2 mm lesion that could represent a scratch mark on the left introitus.  Area is now partially healed.  Speculum exam and use of a large swab reveals no cervical lesions, no bleeding, no source of intravaginal lesion.  Musculoskeletal: Normal range of motion.  Neurological: She is alert and oriented to person, place, and time.  Psychiatric: She has a normal mood and affect. Her behavior is normal. Judgment and thought content normal.  Patient is quite anxious over several things:  Not being able to do what she wants to do with walking program, having to use a device, and not being young.       Assessment:     Incomplete uterovaginal prolapse - pessary #5 ring with support was inserted 09/28/14 and used less than 12 hours. Local irritation and superficial scratch from trying to put prolapse back in before her walk Not wanting to use pessary for the prolapse    Plan:     discussed advantages of use of pessary Discussed use of Estrace cream to treat superficial cut wit pea size amount 3 times weekly for 1 week then go back to twice weekly.  She is to  start at the urethra and go to the introitus.  She was shown by mirror the location and procedure. She has a return apt. on 2/10 with Dr. Quincy Simmonds

## 2014-10-18 ENCOUNTER — Encounter: Payer: Self-pay | Admitting: Nurse Practitioner

## 2014-10-18 NOTE — Progress Notes (Signed)
Reviewed personally.  M. Suzanne Yossef Gilkison, MD.  

## 2014-10-28 ENCOUNTER — Encounter: Payer: Self-pay | Admitting: Obstetrics and Gynecology

## 2014-10-28 ENCOUNTER — Ambulatory Visit (INDEPENDENT_AMBULATORY_CARE_PROVIDER_SITE_OTHER): Payer: 59 | Admitting: Obstetrics and Gynecology

## 2014-10-28 VITALS — BP 160/80 | HR 60 | Ht 66.0 in | Wt 195.8 lb

## 2014-10-28 DIAGNOSIS — N812 Incomplete uterovaginal prolapse: Secondary | ICD-10-CM

## 2014-10-28 NOTE — Progress Notes (Signed)
Patient ID: Sarah Rangel, female   DOB: August 26, 1956, 59 y.o.   MRN: [id number] GYNECOLOGY  VISIT   HPI: 59 y.o.   Married  Caucasian  female   G2P2002 with Patient's last menstrual period was 09/18/2008 (approximate).   here for pessary recheck.   Felt like the pessary was shifting and pinching.  Had a small amount of bleeding which was from a scratch or scrape with placing/removing pessary.   Pessary is now out and patient is using vaginal estrogen 2 -3 times a week.   Patient is starting to think more about potential surgery but would like to get in shape and improve outcome from surgery. Trying to loose weight.  Lost 10 pounds.   GYNECOLOGIC HISTORY: Patient's last menstrual period was 09/18/2008 (approximate). Contraception: vasectomy/postmenopausal  Menopausal hormone therapy: Estrace cream bid        OB History    Gravida Para Term Preterm AB TAB SAB Ectopic Multiple Living   2 2 2       2          Patient Active Problem List   Diagnosis Date Noted  . Worsening headaches 12/05/2013  . Unspecified essential hypertension 11/06/2013  . Stress 11/06/2013    Past Medical History  Diagnosis Date  . History of cystocele   . Hypertension     Past Surgical History  Procedure Laterality Date  . Wisdom tooth extraction      age 59    Current Outpatient Prescriptions  Medication Sig Dispense Refill  . estradiol (ESTRACE) 0.1 MG/GM vaginal cream Use 1/2 g vaginally every night for the first 2 weeks, then use 1/2 g vaginally two times a week. 42.5 g 2  . NIFEdipine (PROCARDIA-XL/ADALAT-CC/NIFEDICAL-XL) 30 MG 24 hr tablet Take 30 mg by mouth daily. Takes 2 daily    . olmesartan (BENICAR) 40 MG tablet Take 1 tablet (40 mg total) by mouth daily. 30 tablet 2   No current facility-administered medications for this visit.     ALLERGIES: Review of patient's allergies indicates no known allergies.  Family History  Problem Relation Age of Onset  . Hypertension Father   .  Colon cancer Neg Hx   . Pancreatic cancer Neg Hx   . Stomach cancer Neg Hx   . Brain cancer Mother   . Lymphoma Mother     History   Social History  . Marital Status: Married    Spouse Name: N/A  . Number of Children: N/A  . Years of Education: N/A   Occupational History  . Not on file.   Social History Main Topics  . Smoking status: Former Smoker    Types: Cigarettes  . Smokeless tobacco: Never Used  . Alcohol Use: 1.2 oz/week    2 Standard drinks or equivalent per week     Comment: occasionally 1 time monthly  . Drug Use: No  . Sexual Activity:    Partners: Male    Birth Control/ Protection: Other-see comments, Post-menopausal     Comment: vasectomy   Other Topics Concern  . Not on file   Social History Narrative    ROS:  Pertinent items are noted in HPI.  PHYSICAL EXAMINATION:    BP 160/80 mmHg  Pulse 60  Ht 5\' 6"  (1.676 m)  Wt 195 lb 12.8 oz (88.814 kg)  BMI 31.62 kg/m2  LMP 09/18/2008 (Approximate)     General appearance: alert, cooperative and appears stated age  Pelvic: External genitalia:  no lesions  Urethra:  normal appearing urethra with no masses, tenderness or lesions              Bartholins and Skenes: normal                 Vagina: normal appearing vagina with normal color and discharge, no lesions.  Almost third degree cystocele, second degree uterine prolapse, first degree rectocele.               Cervix: normal appearance                   Bimanual Exam:  Uterus:  uterus is normal size, shape, consistency and nontender                                      Adnexa: normal adnexa in size, nontender and no masses                                       ASSESSMENT  Incomplete uterovaginal prolapse.  Discomfort with pessary use.  Atrophic vaginal changes.   PLAN  Use the estrogen cream nightly for 2 weeks and then 2 -3 times a week.  Referral to [name] from [facility] for pelvic floor therapy.  Use pessary on a prn  basis.  Discussed basic concepts about surgery for prolapse and maximizing outcome with preop weight loss. Discussed recovery expectations.  Will need urodynamics and reduction of prolapse prior to surgery.   Patient understands.   An After Visit Summary was printed and given to the patient.  ___25___ minutes face to face time of which over 50% was spent in counseling.

## 2014-10-28 NOTE — Patient Instructions (Signed)
Please call if you do not hear about your physical therapy appointment within one week.

## 2014-11-04 ENCOUNTER — Telehealth: Payer: Self-pay | Admitting: Nurse Practitioner

## 2014-11-04 NOTE — Telephone Encounter (Signed)
Patient is calling because she says someone was suppose to call her about scheduling a referral to a physical therapist.

## 2014-11-04 NOTE — Telephone Encounter (Signed)
Patient notified she has appointment at [facility] for 11/18/14 at [place]. Patient states she cannot make that date. Advised can call [facility] directly to reschedule her appointment for a date more convenient for her. She is agreeable. Contact information given.   Routing to provider for final review. Patient agreeable to disposition. Will close encounter

## 2014-12-19 IMAGING — CT CT HEAD W/O CM
1 series · 16 of 30 positions shown, 20 images · non-contrast
Comparison: None.

CLINICAL DATA: Headache

EXAM:
CT HEAD WITHOUT CONTRAST
TECHNIQUE: Contiguous axial images were obtained from the base of the skull
through the vertex without intravenous contrast.

[Series 2: head 5.0 h30s · axial · 0.44mm/px · z∈[-212,-72]mm · 16 of 32 slices shown, 20 images]
[im 2/32  brain]
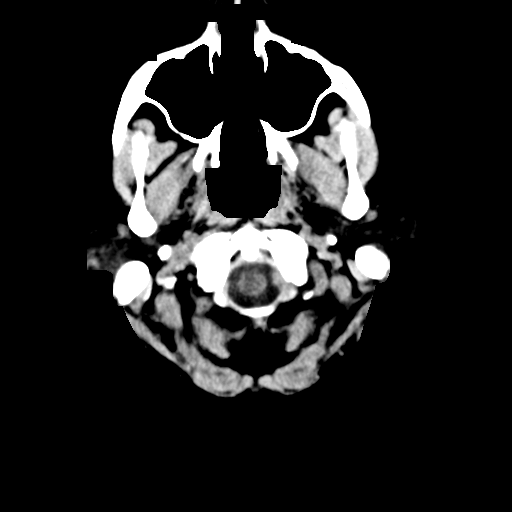
[im 2/32  bone]
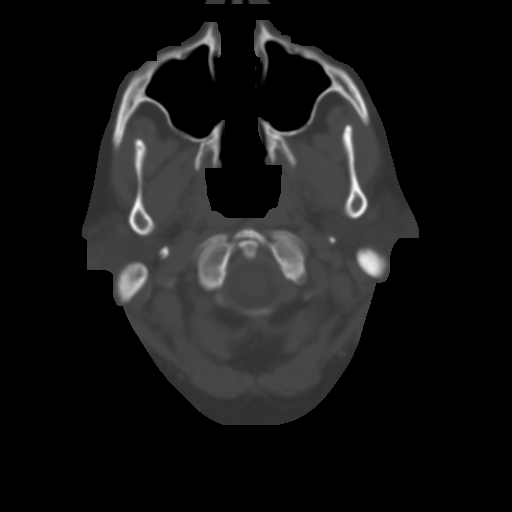
[im 4/32  brain]
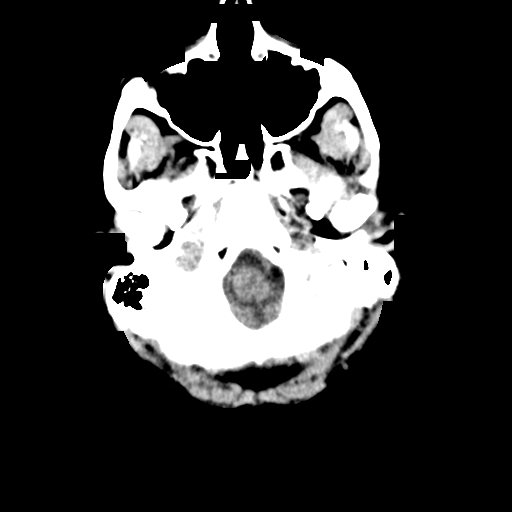
[im 6/32  brain]
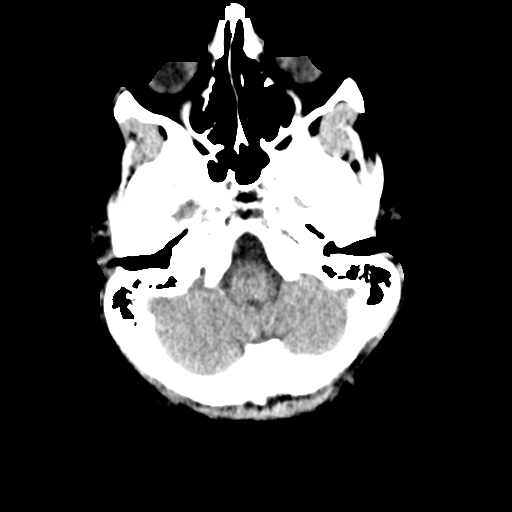
[im 8/32  brain]
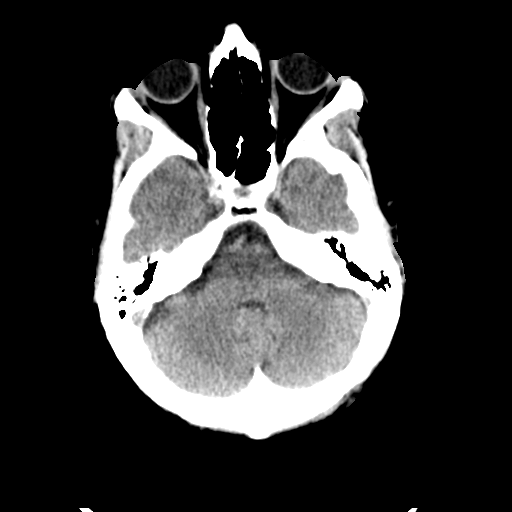
[im 9/32  brain]
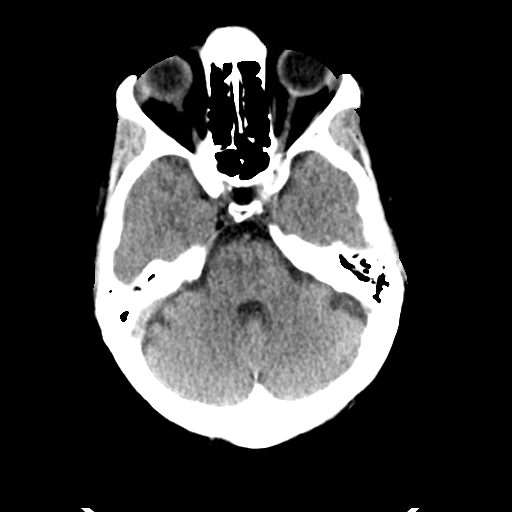
[im 9/32  bone]
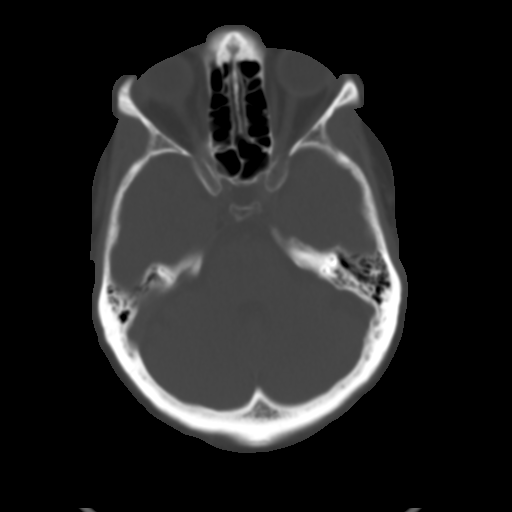
[im 11/32  brain]
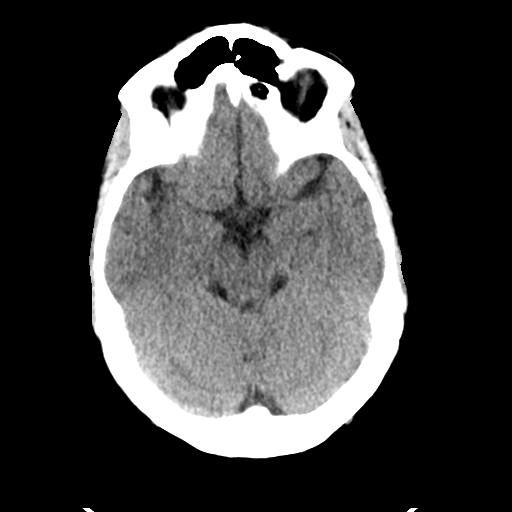
[im 13/32  brain]
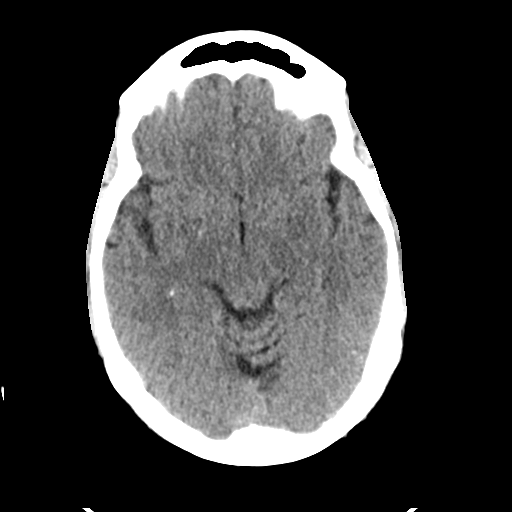
[im 15/32  brain]
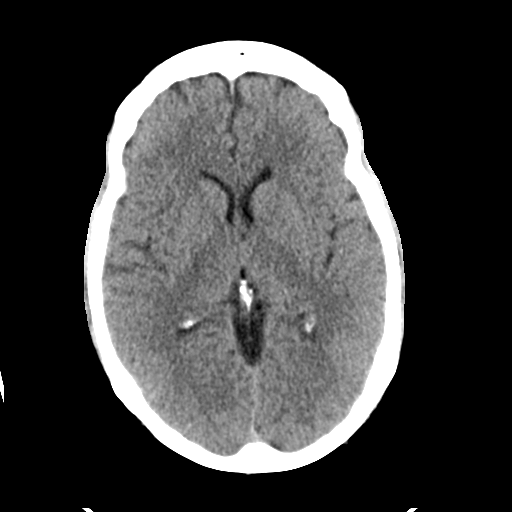
[im 17/32  brain]
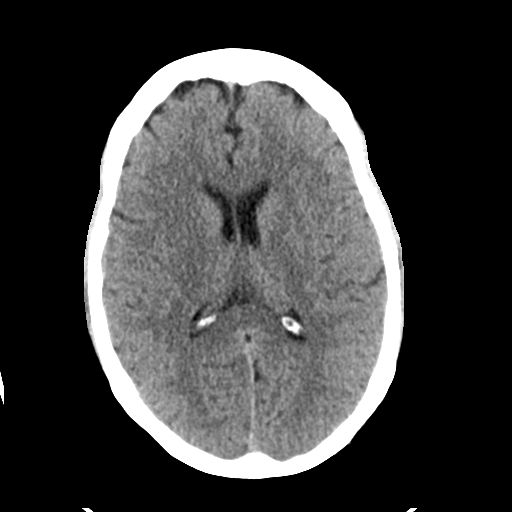
[im 17/32  bone]
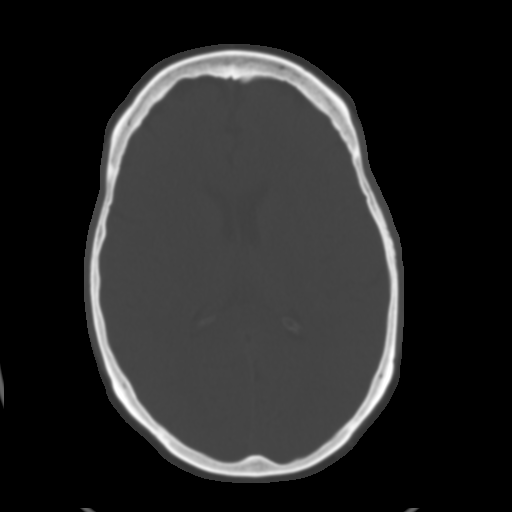
[im 19/32  brain]
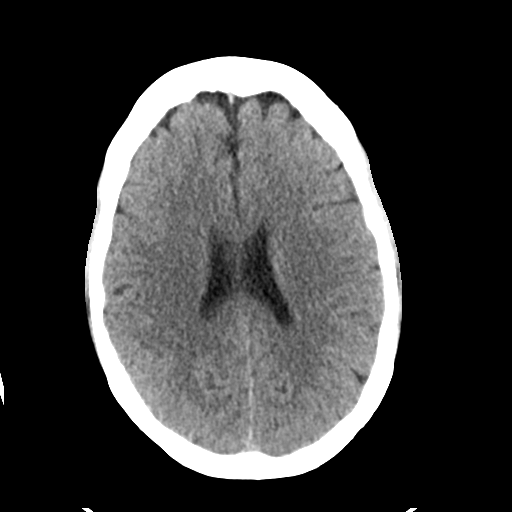
[im 21/32  brain]
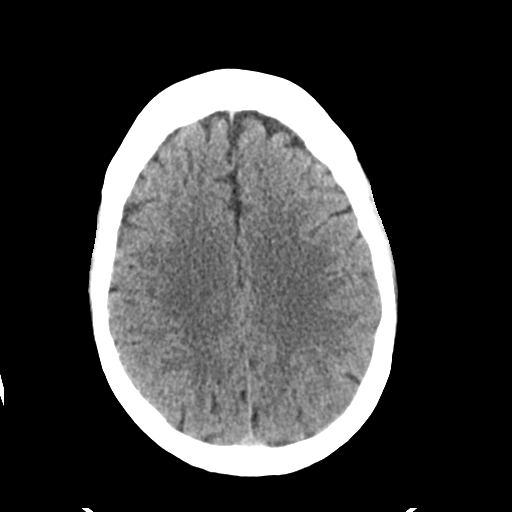
[im 23/32  brain]
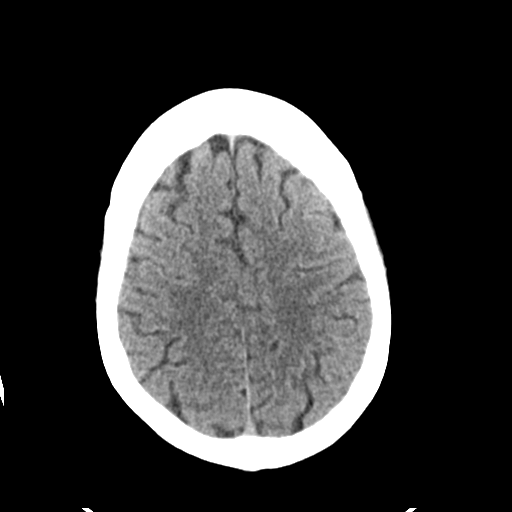
[im 24/32  brain]
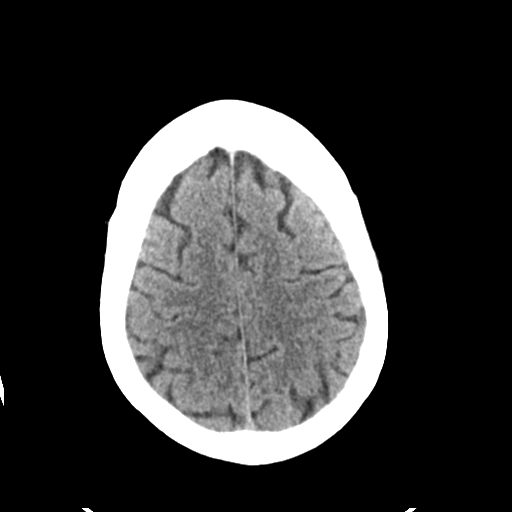
[im 24/32  bone]
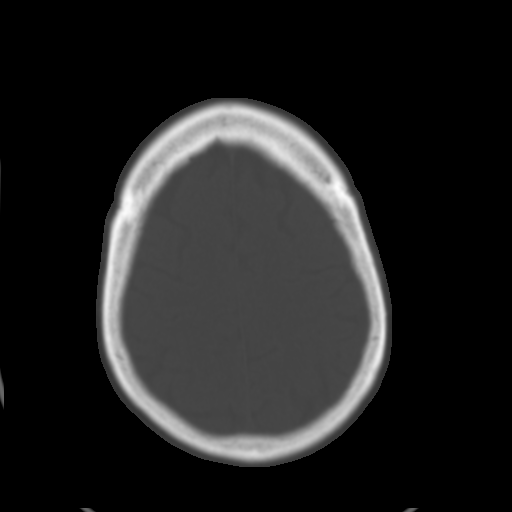
[im 26/32  brain]
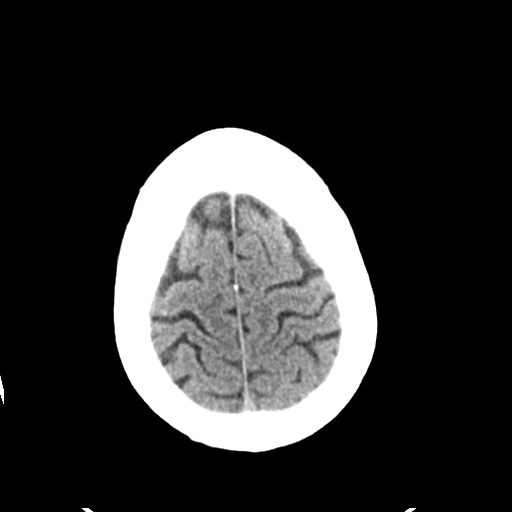
[im 28/32  brain]
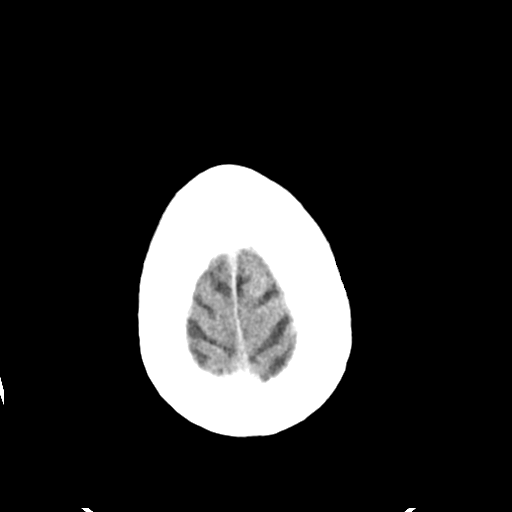
[im 30/32  brain]
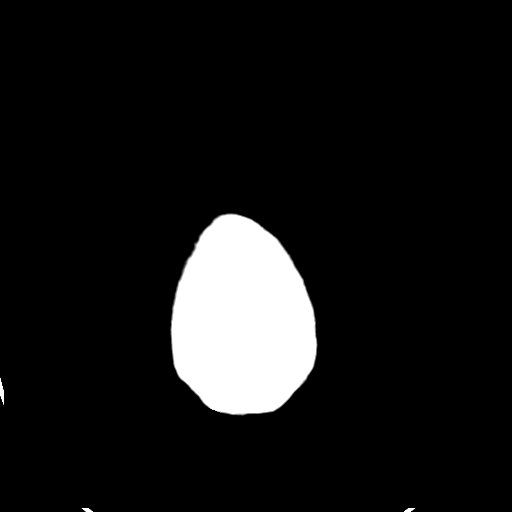

[16 of 30 positions shown; findings below may reference images not displayed]

FINDINGS: No acute intracranial abnormality. Specifically, no hemorrhage,
hydrocephalus, mass lesion, acute infarction, or significant
intracranial injury. No acute calvarial abnormality.
IMPRESSION: No acute intracranial abnormality.

## 2015-01-22 ENCOUNTER — Other Ambulatory Visit: Payer: Self-pay | Admitting: Emergency Medicine

## 2015-03-09 ENCOUNTER — Other Ambulatory Visit: Payer: Self-pay

## 2015-03-09 DIAGNOSIS — Z1231 Encounter for screening mammogram for malignant neoplasm of breast: Secondary | ICD-10-CM

## 2015-03-16 ENCOUNTER — Ambulatory Visit (INDEPENDENT_AMBULATORY_CARE_PROVIDER_SITE_OTHER): Payer: 59 | Admitting: Family Medicine

## 2015-03-16 VITALS — BP 162/78 | HR 71 | Temp 97.6°F | Resp 14 | Ht 66.5 in | Wt 182.6 lb

## 2015-03-16 DIAGNOSIS — Z1329 Encounter for screening for other suspected endocrine disorder: Secondary | ICD-10-CM | POA: Diagnosis not present

## 2015-03-16 DIAGNOSIS — Z13220 Encounter for screening for lipoid disorders: Secondary | ICD-10-CM

## 2015-03-16 DIAGNOSIS — Z131 Encounter for screening for diabetes mellitus: Secondary | ICD-10-CM

## 2015-03-16 DIAGNOSIS — I10 Essential (primary) hypertension: Secondary | ICD-10-CM | POA: Diagnosis not present

## 2015-03-16 DIAGNOSIS — Z130 Encounter for screening for diseases of the blood and blood-forming organs and certain disorders involving the immune mechanism: Secondary | ICD-10-CM | POA: Diagnosis not present

## 2015-03-16 DIAGNOSIS — Z23 Encounter for immunization: Secondary | ICD-10-CM

## 2015-03-16 DIAGNOSIS — N8110 Cystocele, unspecified: Secondary | ICD-10-CM | POA: Diagnosis not present

## 2015-03-16 LAB — CBC
HEMATOCRIT: 39.5 % (ref 36.0–46.0)
Hemoglobin: 14.1 g/dL (ref 12.0–15.0)
MCH: 32 pg (ref 26.0–34.0)
MCHC: 35.7 g/dL (ref 30.0–36.0)
MCV: 89.8 fL (ref 78.0–100.0)
MPV: 8.3 fL — ABNORMAL LOW (ref 8.6–12.4)
Platelets: 164 10*3/uL (ref 150–400)
RBC: 4.4 MIL/uL (ref 3.87–5.11)
RDW: 12.8 % (ref 11.5–15.5)
WBC: 4.1 10*3/uL (ref 4.0–10.5)

## 2015-03-16 LAB — LIPID PANEL
CHOLESTEROL: 188 mg/dL (ref 0–200)
HDL: 68 mg/dL (ref >=46)
LDL Cholesterol: 102 mg/dL — ABNORMAL HIGH (ref 0–99)
Total CHOL/HDL Ratio: 2.8 Ratio
Triglycerides: 92 mg/dL (ref <150)
VLDL: 18 mg/dL (ref 0–40)

## 2015-03-16 LAB — COMPREHENSIVE METABOLIC PANEL
ALK PHOS: 58 U/L (ref 39–117)
ALT: 23 U/L (ref 0–35)
AST: 21 U/L (ref 0–37)
Albumin: 4.9 g/dL (ref 3.5–5.2)
BUN: 17 mg/dL (ref 6–23)
CO2: 23 mEq/L (ref 19–32)
CREATININE: 0.84 mg/dL (ref 0.50–1.10)
Calcium: 9.6 mg/dL (ref 8.4–10.5)
Chloride: 94 mEq/L — ABNORMAL LOW (ref 96–112)
Glucose, Bld: 116 mg/dL — ABNORMAL HIGH (ref 70–99)
Potassium: 4.3 mEq/L (ref 3.5–5.3)
SODIUM: 132 meq/L — AB (ref 135–145)
TOTAL PROTEIN: 7.2 g/dL (ref 6.0–8.3)
Total Bilirubin: 0.5 mg/dL (ref 0.2–1.2)

## 2015-03-16 LAB — HEMOGLOBIN A1C
HEMOGLOBIN A1C: 6.2 % — AB (ref <5.7)
Mean Plasma Glucose: 131 mg/dL — ABNORMAL HIGH (ref <117)

## 2015-03-16 LAB — TSH: TSH: 2.423 u[IU]/mL (ref 0.350–4.500)

## 2015-03-16 MED ORDER — OLMESARTAN MEDOXOMIL 40 MG PO TABS: 40.0000 mg | ORAL_TABLET | Freq: Every day | ORAL | Status: DC

## 2015-03-16 MED ORDER — NIFEDIPINE ER OSMOTIC RELEASE 30 MG PO TB24: 30.0000 mg | ORAL_TABLET | Freq: Two times a day (BID) | ORAL | Status: DC

## 2015-03-16 NOTE — Patient Instructions (Signed)
Good to see you today- continue your current doses of medication I will send you your results on mychart

## 2015-03-16 NOTE — Progress Notes (Addendum)
[facility] [address], [place] [address] [phone] [phone]  Date:  03/16/2015   Name:  Sarah Rangel   DOB:  [birth year]   MRN:  [id number]  PCP:  Jenny Reichmann, MD    Chief Complaint: Medication Refill   History of Present Illness:  Sarah Rangel is a 59 y.o. very pleasant female patient who presents with the following:  She needs RF of her medications today.  She is taking 40 mg of benicar and 60 mg of nifedipine  BP Readings from Last 3 Encounters:  03/16/15 162/78  10/28/14 160/80  10/16/14 180/80   Her BP at home is 135-145/ 75-80  She is not fasting this am- she ate an egg and coffee/ cream this am Would like to have labs done today if possible  Patient Active Problem List   Diagnosis Date Noted  . Worsening headaches 12/05/2013  . Unspecified essential hypertension 11/06/2013  . Stress 11/06/2013    Past Medical History  Diagnosis Date  . History of cystocele   . Hypertension     Past Surgical History  Procedure Laterality Date  . Wisdom tooth extraction      age 79    History  Substance Use Topics  . Smoking status: Former Smoker    Types: Cigarettes  . Smokeless tobacco: Never Used  . Alcohol Use: 1.2 oz/week    2 Standard drinks or equivalent per week     Comment: occasionally 1 time monthly    Family History  Problem Relation Age of Onset  . Hypertension Father   . Colon cancer Neg Hx   . Pancreatic cancer Neg Hx   . Stomach cancer Neg Hx   . Brain cancer Mother   . Lymphoma Mother     No Known Allergies  Medication list has been reviewed and updated.  Current Outpatient Prescriptions on File Prior to Visit  Medication Sig Dispense Refill  . NIFEdipine (NIFEDICAL XL) 30 MG 24 hr tablet Take 1 tablet (30 mg total) by mouth 2 (two) times daily. PATIENT NEEDS OFFICE VISIT FOR ADDITIONAL REFILLS 60 tablet 0  . olmesartan (BENICAR) 40 MG tablet Take 1 tablet (40 mg total) by mouth daily. 30 tablet 2  .  estradiol (ESTRACE) 0.1 MG/GM vaginal cream Use 1/2 g vaginally every night for the first 2 weeks, then use 1/2 g vaginally two times a week. (Patient not taking: Reported on 03/16/2015) 42.5 g 2   No current facility-administered medications on file prior to visit.    Review of Systems:  As per HPI- otherwise negative.   Physical Examination: Filed Vitals:   03/16/15 0937  BP: 162/78  Pulse: 71  Temp: 97.6 F (36.4 C)  Resp: 14   Filed Vitals:   03/16/15 0937  Height: 5' 6.5" (1.689 m)  Weight: 182 lb 9.6 oz (82.827 kg)   Body mass index is 29.03 kg/(m^2). Ideal Body Weight: Weight in (lb) to have BMI = 25: 156.9  GEN: WDWN, NAD, Non-toxic, A & O x 3, looks well, mild overweight HEENT: Atraumatic, Normocephalic. Neck supple. No masses, No LAD. Ears and Nose: No external deformity. CV: RRR, No M/G/R. No JVD. No thrill. No extra heart sounds. PULM: CTA B, no wheezes, crackles, rhonchi. No retractions. No resp. distress. No accessory muscle use. EXTR: No c/c/e NEURO Normal gait.  PSYCH: Normally interactive. Conversant. Not depressed or anxious appearing.  Calm demeanor.   Wt Readings from Last 3 Encounters:  03/16/15  182 lb 9.6 oz (82.827 kg)  10/28/14 195 lb 12.8 oz (88.814 kg)  10/16/14 197 lb 9.6 oz (89.631 kg)    Assessment and Plan: Essential hypertension - Plan: olmesartan (BENICAR) 40 MG tablet, NIFEdipine (NIFEDICAL XL) 30 MG 24 hr tablet, Comprehensive metabolic panel  Screening for diabetes mellitus - Plan: Comprehensive metabolic panel, Hemoglobin A1c  Screening for hyperlipidemia - Plan: Lipid panel  Screening for hypothyroidism - Plan: TSH  Immunization due - Plan: Tdap vaccine greater than or equal to 7yo IM  Bladder prolapse, female, acquired  She has a mildly symptomatic prolapsed bladder- her GYN Dr. Quincy Simmonds is helping her with this.  She is considering surgical treatment for same- discussed this with her.  She has not yet decided if she wants to  pursue .  Helped her to consider her options Labs pending as above.  Refilled her BP medications, tdap today.  Although her BP is high here she reports that is it ok at home Will follow-up with her pending her labs   Meds ordered this encounter  Medications  . olmesartan (BENICAR) 40 MG tablet    Sig: Take 1 tablet (40 mg total) by mouth daily.    Dispense:  90 tablet    Refill:  3  . NIFEdipine (NIFEDICAL XL) 30 MG 24 hr tablet    Sig: Take 1 tablet (30 mg total) by mouth 2 (two) times daily.    Dispense:  180 tablet    Refill:  3     Signed Lamar Blinks, MD

## 2015-03-18 ENCOUNTER — Encounter: Payer: Self-pay | Admitting: Family Medicine

## 2015-04-09 ENCOUNTER — Ambulatory Visit (INDEPENDENT_AMBULATORY_CARE_PROVIDER_SITE_OTHER): Payer: 59 | Admitting: Physician Assistant

## 2015-04-09 VITALS — BP 118/84 | HR 65 | Temp 98.0°F | Resp 18 | Ht 66.0 in | Wt 178.4 lb

## 2015-04-09 DIAGNOSIS — J029 Acute pharyngitis, unspecified: Secondary | ICD-10-CM | POA: Diagnosis not present

## 2015-04-09 DIAGNOSIS — H6061 Unspecified chronic otitis externa, right ear: Secondary | ICD-10-CM

## 2015-04-09 LAB — POCT RAPID STREP A (OFFICE): Rapid Strep A Screen: NEGATIVE

## 2015-04-09 MED ORDER — HYDROCORTISONE-ACETIC ACID 1-2 % OT SOLN: 3.0000 [drp] | Freq: Two times a day (BID) | OTIC | Status: DC

## 2015-04-09 NOTE — Patient Instructions (Signed)
Take tylenol 1000 mg every 8 hours as needed for throat pain.    Pharyngitis Pharyngitis is a sore throat (pharynx). There is redness, pain, and swelling of your throat. HOME CARE   Drink enough fluids to keep your pee (urine) clear or pale yellow.  Only take medicine as told by your doctor.  You may get sick again if you do not take medicine as told. Finish your medicines, even if you start to feel better.  Do not take aspirin.  Rest.  Rinse your mouth (gargle) with salt water ( tsp of salt per 1 qt of water) every 1-2 hours. This will help the pain.  If you are not at risk for choking, you can suck on hard candy or sore throat lozenges. GET HELP IF:  You have large, tender lumps on your neck.  You have a rash.  You cough up green, yellow-brown, or bloody spit. GET HELP RIGHT AWAY IF:   You have a stiff neck.  You drool or cannot swallow liquids.  You throw up (vomit) or are not able to keep medicine or liquids down.  You have very bad pain that does not go away with medicine.  You have problems breathing (not from a stuffy nose). MAKE SURE YOU:   Understand these instructions.  Will watch your condition.  Will get help right away if you are not doing well or get worse. Document Released: 02/21/2008 Document Revised: 06/25/2013 Document Reviewed: 05/12/2013 [facility] Patient Information 2015 [place], [place]. This information is not intended to replace advice given to you by your health care provider. Make sure you discuss any questions you have with your health care provider.

## 2015-04-09 NOTE — Progress Notes (Addendum)
04/09/2015 at 11:36 AM  Sarah Rangel / DOB: 1956-01-19 / MRN: [id number]  The patient has Unspecified essential hypertension; Stress; and Worsening headaches on her problem list.  SUBJECTIVE  Chief complaint: Sore Throat and Ear Pain  Sarah Rangel is a 59 y.o. female complaining of sore throat and ear prressure that started 3 days ago.  Associated symptoms include congestion today, and she denies fever, cough, difficulty breathing, headache and jaw pain.The patient symptoms show no change. Treatments tried thus far include nothing. She denies sick contacts. She denies a history of seasonal allergies.   Patient also requesting Polytrim for left ear pain that occurs occasionally after swimming. Sates that she will try using using a combination of rubbing alcohol and hydrogen peroxide after swimming and this works most of time, however sometimes she needs to use the Polytrim.    She  has a past medical history of History of cystocele and Hypertension.    Medications reviewed and updated by myself where necessary, and exist elsewhere in the encounter.   Sarah Rangel has No Known Allergies. She  reports that she has quit smoking. Her smoking use included Cigarettes. She has never used smokeless tobacco. She reports that she drinks about 1.2 oz of alcohol per week. She reports that she does not use illicit drugs. She  reports that she currently engages in sexual activity and has had female partners. She reports using the following methods of birth control/protection: Other-see comments and Post-menopausal. The patient  has past surgical history that includes Wisdom tooth extraction.  Her family history includes Brain cancer in her mother; Hypertension in her father; Lymphoma in her mother. There is no history of Colon cancer, Pancreatic cancer, or Stomach cancer.  Review of Systems  Gastrointestinal: Negative for nausea.  Skin: Negative for rash.  Neurological: Negative for dizziness.     OBJECTIVE  Her  height is 5\' 6"  (1.676 m) and weight is 178 lb 6.4 oz (80.922 kg). Her oral temperature is 98 F (36.7 C). Her blood pressure is 118/84 and her pulse is 65. Her respiration is 18 and oxygen saturation is 98%.  The patient's body mass index is 28.81 kg/(m^2).  Physical Exam  Constitutional: She is oriented to person, place, and time. She appears well-developed and well-nourished. No distress.  HENT:  Right Ear: Hearing, tympanic membrane, external ear and ear canal normal.  Left Ear: Hearing, tympanic membrane, external ear and ear canal normal.  Nose: Mucosal edema present. Right sinus exhibits no maxillary sinus tenderness and no frontal sinus tenderness. Left sinus exhibits no maxillary sinus tenderness and no frontal sinus tenderness.  Mouth/Throat: Uvula is midline, oropharynx is clear and moist and mucous membranes are normal.  Cardiovascular: Normal rate, regular rhythm and normal heart sounds.   Respiratory: Effort normal and breath sounds normal. She has no wheezes. She has no rales.  Neurological: She is alert and oriented to person, place, and time.  Skin: Skin is warm and dry. She is not diaphoretic.  Psychiatric: She has a normal mood and affect.    No results found for this or any previous visit (from the past 24 hour(s)).  ASSESSMENT & PLAN  Sarah Rangel was seen today for sore throat and ear pain.  Diagnoses and all orders for this visit:  Sore throat: Most likely viral and/or allergic in nature.  Will await culture results before treating with ABx.  Advised symtomatic treatment.  Advised patient to avoid NSAIDS due to history of HTN.  She  is to try tylenol for pain control at 1 gram q 8 hours.   Orders: -     POCT rapid strep A -     Cancel: POCT Influenza A/B -     Culture, Group A Strep  Chronic otitis externa, right: Uncomfortable with prescribing antibiotics prophylactically. Will manage with the below and advised that she RTC if this dose not  control her symptoms.     Orders: -     acetic acid-hydrocortisone (VOSOL-HC) otic solution; Place 3 drops into the right ear 2 (two) times daily.    The patient was advised to call or come back to clinic if she does not see an improvement in symptoms, or worsens with the above plan.   Philis Fendt, [name], [name] 04/09/2015 11:36 AM

## 2015-04-10 LAB — CULTURE, GROUP A STREP: ORGANISM ID, BACTERIA: NORMAL

## 2015-04-12 ENCOUNTER — Ambulatory Visit
Admission: RE | Admit: 2015-04-12 | Discharge: 2015-04-12 | Disposition: A | Discharge status: Home / Self Care | Payer: 59 | Source: Ambulatory Visit

## 2015-04-12 DIAGNOSIS — Z1231 Encounter for screening mammogram for malignant neoplasm of breast: Secondary | ICD-10-CM

## 2015-04-26 ENCOUNTER — Ambulatory Visit: Payer: 59 | Admitting: Family Medicine

## 2015-06-22 ENCOUNTER — Encounter: Payer: Self-pay | Admitting: Emergency Medicine

## 2015-06-24 ENCOUNTER — Telehealth: Payer: Self-pay | Admitting: Obstetrics and Gynecology

## 2015-06-24 NOTE — Telephone Encounter (Signed)
Spoke with patient. She states she feels her prolapse has increased and would like Dr. Quincy Simmonds to check. She denies any problems with voiding or having a bowel movement. Office visit scheduled with Dr. Quincy Simmonds for patient choice of date 07/05/15. She is advised to call back with any change in symptoms or problems with voiding or bowel movement. Patient agreeable.  Routing to provider for final review. Patient agreeable to disposition. Will close encounter.

## 2015-06-24 NOTE — Telephone Encounter (Signed)
Patient is asking for an appointment with Dr.Silva to recheck her cystocele. Last seen 10/28/14.

## 2015-07-05 ENCOUNTER — Ambulatory Visit (INDEPENDENT_AMBULATORY_CARE_PROVIDER_SITE_OTHER): Payer: 59 | Admitting: Obstetrics and Gynecology

## 2015-07-05 ENCOUNTER — Encounter: Payer: Self-pay | Admitting: Obstetrics and Gynecology

## 2015-07-05 VITALS — BP 168/86 | HR 70 | Ht 66.0 in | Wt 177.8 lb

## 2015-07-05 DIAGNOSIS — N812 Incomplete uterovaginal prolapse: Secondary | ICD-10-CM | POA: Diagnosis not present

## 2015-07-05 NOTE — Progress Notes (Signed)
Patient ID: Sarah Rangel, female   DOB: 19-May-1956, 59 y.o.   MRN: [id number] GYNECOLOGY  VISIT   HPI: 59 y.o.   Married  Caucasian  female   G2P2002 with Patient's last menstrual period was 09/18/2008 (approximate).   here for evaluation of uterine prolapse.  Patient states this is getting worse and ready to discuss surgery.   Office visit 10/28/14 - Almost third degree cystocele, second degree uterine prolapse, first degree rectocele.   Feeling her cervix now.  Asking about maintaining cervix.  Not using the pessary. Did not feel right after one hour of use.  Did physical therapy with [name].   No leakage of urine.  Voiding well.  Does manipulate to get a better stream by pushing up to void.  No constipation or loss of control of stool.  No vaginal bleeding.  Not using vaginal estrogen cream.   Difficult to remember.   No prior urodynamics. Lost 25 pounds through intentional weight loss.   GYNECOLOGIC HISTORY: Patient's last menstrual period was 09/18/2008 (approximate). Contraception:vasectomy/postmenopausal Menopausal hormone therapy: Estrace cream 2-3 times weekly. Last mammogram: 04-13-15 Density Cat.B/Neg/BiRads [facility] Last pap smear: 08-19-14 Neg:Neg HR HPV Had an ASCUS pap and negative HR HPV at [facility].  No treatment given.         OB History    Gravida Para Term Preterm AB TAB SAB Ectopic Multiple Living   2 2 2       2          Patient Active Problem List   Diagnosis Date Noted  . Worsening headaches 12/05/2013  . Unspecified essential hypertension 11/06/2013  . Stress 11/06/2013    Past Medical History  Diagnosis Date  . History of cystocele   . Hypertension     Past Surgical History  Procedure Laterality Date  . Wisdom tooth extraction      age 89    Current Outpatient Prescriptions  Medication Sig Dispense Refill  . NIFEdipine (NIFEDICAL XL) 30 MG 24 hr tablet Take 1 tablet (30 mg total) by mouth 2 (two) times  daily. 180 tablet 3  . olmesartan (BENICAR) 40 MG tablet Take 1 tablet (40 mg total) by mouth daily. 90 tablet 3   No current facility-administered medications for this visit.     ALLERGIES: Review of patient's allergies indicates no known allergies.  Family History  Problem Relation Age of Onset  . Hypertension Father   . Colon cancer Neg Hx   . Pancreatic cancer Neg Hx   . Stomach cancer Neg Hx   . Brain cancer Mother   . Lymphoma Mother     Social History   Social History  . Marital Status: Married    Spouse Name: N/A  . Number of Children: N/A  . Years of Education: N/A   Occupational History  . Not on file.   Social History Main Topics  . Smoking status: Former Smoker    Types: Cigarettes  . Smokeless tobacco: Never Used  . Alcohol Use: 1.2 oz/week    2 Standard drinks or equivalent per week     Comment: occasionally 1 time monthly  . Drug Use: No  . Sexual Activity:    Partners: Male    Birth Control/ Protection: Other-see comments, Post-menopausal     Comment: vasectomy   Other Topics Concern  . Not on file   Social History Narrative    ROS:  Pertinent items are noted in HPI.  PHYSICAL EXAMINATION:  BP 168/86 mmHg  Pulse 70  Ht 5\' 6"  (1.676 m)  Wt 177 lb 12.8 oz (80.65 kg)  BMI 28.71 kg/m2  LMP 09/18/2008 (Approximate)    General appearance: alert, cooperative and appears stated age   Abdomen: soft, non-tender; bowel sounds normal; no masses,  no organomegaly   Pelvic: External genitalia:  no lesions              Urethra:  normal appearing urethra with no masses, tenderness or lesions              Bartholins and Skenes: normal                 Vagina: normal appearing vagina with normal color and discharge, no lesions.  Third - fourth degree cystocele, second degree uterine prolapse, first degree rectocele.              Cervix: no lesions             Bimanual Exam:  Uterus:  normal size, contour, position, consistency, mobility, non-tender               Adnexa: normal adnexa and no mass, fullness, tenderness              Rectovaginal: Yes.  .  Confirms.              Anus:  normal sphincter tone, no lesions  Chaperone was present for exam.  ASSESSMENT  Uterovaginal prolapse - incomplete.  Progressive. Hx ASCUS paps.   PLAN  I have had a comprehensive discussion with the patient regarding prolapse and urinary incontinence.  I have provided reading materials from ACOG regarding prolapse and incontinence in general as well as medical and surgical treatment for these conditions.   We discussed total abdominal hysterectomy with bilateral salpingo-oophorectomy, abdominal sacrocolpopexy with permanent graft, Halban's culdoplasty, anterior and posterior colporrhaphy, and TVT Exact midurethral sling, and cystoscopy.  We discussed benefits and risks of surgery which include but are not limited to bleeding, infection, damage to surrounding organs, ureteral damage, vaginal pain with intercourse, permanent mesh use which may cause erosion and exposure in the vagina, urethra, bladder or ureters; slower voiding and urinary retention, possible need for prolonged catheterization and/or self catheterization, de novo overactive bladder symptoms, reoperation, recurrence of prolapse and incontinence,  DVT, PE, death, and reaction to anesthesia.    I have discussed surgical expectations regarding the procedures and success rates, outcomes, and recovery.  Use vagina estrogen cream 1/2 gram pv at hs 2 times per week.   Patient will return for annual exam in December and further conversation about potential surgery planning at that time.  She will need urodynamics with reduction of the prolapse prior to surgery.    An After Visit Summary was printed and given to the patient.  __40____ minutes face to face time of which over 50% was spent in counseling.

## 2015-07-15 ENCOUNTER — Telehealth: Payer: Self-pay | Admitting: Obstetrics and Gynecology

## 2015-07-15 NOTE — Telephone Encounter (Signed)
Made in error

## 2015-07-29 ENCOUNTER — Encounter: Payer: Self-pay | Admitting: Obstetrics and Gynecology

## 2015-07-29 ENCOUNTER — Telehealth: Payer: Self-pay | Admitting: Emergency Medicine

## 2015-07-29 DIAGNOSIS — N812 Incomplete uterovaginal prolapse: Secondary | ICD-10-CM

## 2015-07-29 NOTE — Telephone Encounter (Signed)
Spoke with patient and urodynamics instructions given. Scheduled urodynamics procedure for 08/18/15 at 0900 Advised to stop all bladder medications one week prior to procedure, patient states she is not on any medications. Arrive with comfortably full bladder.  Advised will need pre procedure UA to check for any infection prior. Scheduled for 08/11/15 at 0830 for urinalysis.  Scheduled follow up with Dr. Quincy Simmonds  for 09/01/15 (annual exam) Brief description of procedure given.  Patient verbalizes understanding of instructions and agreeable to appointments as scheduled.   Dr. Quincy Simmonds, okay as scheduled?

## 2015-07-29 NOTE — Telephone Encounter (Signed)
Patient calling to speak with [place].

## 2015-07-29 NOTE — Telephone Encounter (Signed)
The annual exam and follow up for the urodynamics will be two separate visits but done at the same time.  There is an annual exam fee and then a problem visit fee that will be included on this visit.  The urodynamics results and discussion is outside the scope of the annual exam visit.  Thanks.

## 2015-07-29 NOTE — Telephone Encounter (Signed)
Responded to patient via mychart. Ordered urodynamics for pre-certification.   Will close this encounter.

## 2015-07-29 NOTE — Telephone Encounter (Signed)
Chief Complaint  Patient presents with  . Advice Only    Scheduling Urodynamics procedure/Patient sent mychart message.     ===View-only below this line===   ----- Message -----    From: [name]    Sent: 07/29/2015  1:19 PM EST      To: Arloa Koh, MD Subject: Non-Urgent Medical Question  Dr Quincy Simmonds, I am considering scheduling surgery for cystocele repair,etc the first of March 2017.  I wondered if  I could get the urodynamic testing needed prior to that surgery before the end of this year. I have some money in my [facility] [employer] account that I need to spend by the end of 2016 or lose it. I could use that money to cover my cost for the testing.   Thanks for your help.  Sarah Rangel

## 2015-07-30 ENCOUNTER — Telehealth: Payer: Self-pay | Admitting: Emergency Medicine

## 2015-07-30 NOTE — Telephone Encounter (Signed)
Discussed benefits for urodynamics previously with patient on 07/15/15 as noted in guarantor account. She called on an informational only basis for urodynamics and possible TAH in order to plan accordingly. I reviewed both benefits with her. If she would like to speak with Jacqlyn Larsen again to review urodynamics benefits, now that she is scheduled, please transfer call once triage speaks with her regarding consult scheduling.

## 2015-07-30 NOTE — Telephone Encounter (Signed)
Message left to return call to [place] at [phone].   Need to schedule consult after urodynamics and before annual exam if possible.

## 2015-08-02 NOTE — Telephone Encounter (Signed)
Message left to return call to [place] at [phone].   Need to schedule post urodynamics consult with Dr. Quincy Simmonds.

## 2015-08-02 NOTE — Telephone Encounter (Signed)
Patient scheduled for consult with Dr. Quincy Simmonds 08/27/15 for post urodynamics results.  Routing to provider for final review. Patient agreeable to disposition. Will close encounter.

## 2015-08-04 ENCOUNTER — Encounter: Payer: Self-pay | Admitting: Emergency Medicine

## 2015-08-11 ENCOUNTER — Ambulatory Visit (INDEPENDENT_AMBULATORY_CARE_PROVIDER_SITE_OTHER): Payer: 59

## 2015-08-11 VITALS — BP 140/78 | HR 76 | Resp 18 | Ht 66.0 in | Wt 178.2 lb

## 2015-08-11 DIAGNOSIS — R32 Unspecified urinary incontinence: Secondary | ICD-10-CM

## 2015-08-11 LAB — POCT URINALYSIS DIPSTICK
Bilirubin, UA: NEGATIVE
Blood, UA: NEGATIVE
Glucose, UA: NEGATIVE
KETONES UA: NEGATIVE
LEUKOCYTES UA: NEGATIVE
Nitrite, UA: NEGATIVE
PH UA: 5
Protein, UA: NEGATIVE
Spec Grav, UA: 1.01
Urobilinogen, UA: NEGATIVE

## 2015-08-11 NOTE — Progress Notes (Signed)
Pt is here today for Pre-Urodynamics UA. UA dipstick was NEGATIVE. PT states she was concerned about a sore on her vagina that would bleed here and there. Pt was given results of UA and advised to give [place] a call with any further concerns.

## 2015-08-12 ENCOUNTER — Encounter (HOSPITAL_COMMUNITY): Payer: Self-pay | Admitting: Emergency Medicine

## 2015-08-12 ENCOUNTER — Emergency Department (HOSPITAL_COMMUNITY)
Admission: EM | Admit: 2015-08-12 | Discharge: 2015-08-12 | Disposition: A | Discharge status: Home / Self Care | Payer: 59 | Attending: Emergency Medicine | Admitting: Emergency Medicine

## 2015-08-12 DIAGNOSIS — I10 Essential (primary) hypertension: Secondary | ICD-10-CM | POA: Diagnosis not present

## 2015-08-12 DIAGNOSIS — G8929 Other chronic pain: Secondary | ICD-10-CM | POA: Insufficient documentation

## 2015-08-12 DIAGNOSIS — R519 Headache, unspecified: Secondary | ICD-10-CM

## 2015-08-12 DIAGNOSIS — Z87891 Personal history of nicotine dependence: Secondary | ICD-10-CM | POA: Insufficient documentation

## 2015-08-12 DIAGNOSIS — R51 Headache: Secondary | ICD-10-CM | POA: Insufficient documentation

## 2015-08-12 DIAGNOSIS — E871 Hypo-osmolality and hyponatremia: Secondary | ICD-10-CM | POA: Insufficient documentation

## 2015-08-12 DIAGNOSIS — Z87448 Personal history of other diseases of urinary system: Secondary | ICD-10-CM | POA: Insufficient documentation

## 2015-08-12 DIAGNOSIS — Z79899 Other long term (current) drug therapy: Secondary | ICD-10-CM | POA: Insufficient documentation

## 2015-08-12 LAB — I-STAT CHEM 8, ED
BUN: 23 mg/dL — ABNORMAL HIGH (ref 6–20)
Calcium, Ion: 1.15 mmol/L (ref 1.13–1.30)
Chloride: 93 mmol/L — ABNORMAL LOW (ref 101–111)
Creatinine, Ser: 0.9 mg/dL (ref 0.44–1.00)
Glucose, Bld: 113 mg/dL — ABNORMAL HIGH (ref 65–99)
HCT: 38 % (ref 36.0–46.0)
Hemoglobin: 12.9 g/dL (ref 12.0–15.0)
Potassium: 4 mmol/L (ref 3.5–5.1)
Sodium: 128 mmol/L — ABNORMAL LOW (ref 135–145)
TCO2: 21 mmol/L (ref 0–100)

## 2015-08-12 MED ORDER — DIPHENHYDRAMINE HCL 25 MG PO CAPS
25.0000 mg | ORAL_CAPSULE | Freq: Once | ORAL | Status: AC
  Administered 2015-08-12: 25 mg via ORAL
  Filled 2015-08-12: qty 1

## 2015-08-12 MED ORDER — KETOROLAC TROMETHAMINE 30 MG/ML IJ SOLN
30.0000 mg | Freq: Once | INTRAMUSCULAR | Status: AC
  Administered 2015-08-12: 30 mg via INTRAMUSCULAR
  Filled 2015-08-12: qty 1

## 2015-08-12 MED ORDER — METOCLOPRAMIDE HCL 10 MG PO TABS
10.0000 mg | ORAL_TABLET | Freq: Once | ORAL | Status: AC
  Administered 2015-08-12: 10 mg via ORAL
  Filled 2015-08-12: qty 1

## 2015-08-12 MED ORDER — SODIUM CHLORIDE 0.9 % IV BOLUS (SEPSIS)
1000.0000 mL | Freq: Once | INTRAVENOUS | Status: AC
  Administered 2015-08-12: 1000 mL via INTRAVENOUS

## 2015-08-12 NOTE — ED Notes (Signed)
Patient reports her headache is better and reports pain of 3/10.

## 2015-08-12 NOTE — Discharge Instructions (Signed)
General Headache Without Cause A headache is pain or discomfort felt around the head or neck area. The specific cause of a headache may not be found. There are many causes and types of headaches. A few common ones are:  Tension headaches.  Migraine headaches.  Cluster headaches.  Chronic daily headaches. HOME CARE INSTRUCTIONS  Watch your condition for any changes. Take these steps to help with your condition: Managing Pain  Take over-the-counter and prescription medicines only as told by your health care provider.  Lie down in a dark, quiet room when you have a headache.  If directed, apply ice to the head and neck area:  Put ice in a plastic bag.  Place a towel between your skin and the bag.  Leave the ice on for 20 minutes, 2-3 times per day.  Use a heating pad or hot shower to apply heat to the head and neck area as told by your health care provider.  Keep lights dim if bright lights bother you or make your headaches worse. Eating and Drinking  Eat meals on a regular schedule.  Limit alcohol use.  Decrease the amount of caffeine you drink, or stop drinking caffeine. General Instructions  Keep all follow-up visits as told by your health care provider. This is important.  Keep a headache journal to help find out what may trigger your headaches. For example, write down:  What you eat and drink.  How much sleep you get.  Any change to your diet or medicines.  Try massage or other relaxation techniques.  Limit stress.  Sit up straight, and do not tense your muscles.  Do not use tobacco products, including cigarettes, chewing tobacco, or e-cigarettes. If you need help quitting, ask your health care provider.  Exercise regularly as told by your health care provider.  Sleep on a regular schedule. Get 7-9 hours of sleep, or the amount recommended by your health care provider. SEEK MEDICAL CARE IF:   Your symptoms are not helped by medicine.  You have a  headache that is different from the usual headache.  You have nausea or you vomit.  You have a fever. SEEK IMMEDIATE MEDICAL CARE IF:   Your headache becomes severe.  You have repeated vomiting.  You have a stiff neck.  You have a loss of vision.  You have problems with speech.  You have pain in the eye or ear.  You have muscular weakness or loss of muscle control.  You lose your balance or have trouble walking.  You feel faint or pass out.  You have confusion.   This information is not intended to replace advice given to you by your health care provider. Make sure you discuss any questions you have with your health care provider.   Document Released: 09/04/2005 Document Revised: 05/26/2015 Document Reviewed: 12/28/2014 Elsevier Interactive Patient Education 2016 [employer].  Please follow-up with PCP tomorrow for further evaluation and management , please return immediately if new or worsening signs or symptoms present

## 2015-08-12 NOTE — ED Notes (Signed)
Pt c/o hypertension, posterior headache, and "funky" feeling to left arm onset today. Patient unable to further describe her arm pain.

## 2015-08-12 NOTE — ED Notes (Signed)
Md aware of abnormal chem 8 values

## 2015-08-12 NOTE — ED Provider Notes (Signed)
CSN: [id number]     Arrival date & time 08/12/15  1439 History   First MD Initiated Contact with Patient 08/12/15 1519     Chief Complaint  Patient presents with  . Hypertension  . Headache    HPI   59 -year-old female presents today with headache and hypertension. Patient reports that she has a history of chronic headaches, usually resolved with Tylenol. She reports that she woke up at 2 AM this morning with a very mild global headache, reports taking Tylenol and going back to bed. Patient reports that she woke at her normal weight time at 4 AM to take her hypertensive medication, reports that the headache was still persistent and still mild and unchanged. Patient reports taking her blood pressure medication, getting out of bed making a fire and cooking dinner for the day. Patient reports that she continued to have this global headache remembering that she may not have taken all of her blood pressure medication. She went back to her medicine cabinet noted that her Procardia was still in there. She reports taking her antihypertensive medication at approximately 1:30 PM today. Seen after check blood pressure and it was in the 190s, continue with the global headache at that time. Patient reports that this feels similar to previous headaches, although it has been persistent. Patient admits that she has been extremely anxious and more stress than usual over the last several weeks due to some upcoming urodynamic testing, family situations including the holiday. She originally noted in nursing notes that she had a "funny feeling" in her arm, time of evaluation patient reports that she does not have any sort of abnormal sensation in her arm, she would not elaborate on the sensation reporting that is "nothing" and that she was "feeding the stress." Patient reports symptoms today are similar to previous headaches, she denies any acute onset, severe headache, loss of sensation strength or motor function, no change  smelt taste or vision, denies chest pain, shortness of breath, abdominal pain, changes in the color clarity characteristics of her urine. She denies any trauma, fever, neck stiffness, or any other red flags.   Past Medical History  Diagnosis Date  . History of cystocele   . Hypertension    Past Surgical History  Procedure Laterality Date  . Wisdom tooth extraction      age 65   Family History  Problem Relation Age of Onset  . Hypertension Father   . Colon cancer Neg Hx   . Pancreatic cancer Neg Hx   . Stomach cancer Neg Hx   . Brain cancer Mother   . Lymphoma Mother    Social History  Substance Use Topics  . Smoking status: Former Smoker    Types: Cigarettes  . Smokeless tobacco: Never Used  . Alcohol Use: 1.2 oz/week    2 Standard drinks or equivalent per week     Comment: occasionally 1 time monthly   OB History    Gravida Para Term Preterm AB TAB SAB Ectopic Multiple Living   2 2 2       2      Review of Systems  All other systems reviewed and are negative.   Allergies  Review of patient's allergies indicates no known allergies.  Home Medications   Prior to Admission medications   Medication Sig Start Date End Date Taking? Authorizing Provider  NIFEdipine (NIFEDICAL XL) 30 MG 24 hr tablet Take 1 tablet (30 mg total) by mouth 2 (two) times daily. 03/16/15  Gay Filler Copland, MD  olmesartan (BENICAR) 40 MG tablet Take 1 tablet (40 mg total) by mouth daily. 03/16/15   Gay Filler Copland, MD   BP 151/82 mmHg  Pulse 65  Temp(Src) 98.9 F (37.2 C) (Oral)  Resp 16  Ht 5\' 6"  (1.676 m)  Wt 80.287 kg  BMI 28.58 kg/m2  SpO2 98%  LMP 09/18/2008 (Approximate)   Physical Exam  Constitutional: She is oriented to person, place, and time. She appears well-developed and well-nourished.  HENT:  Head: Normocephalic and atraumatic.  Eyes: Conjunctivae are normal. Pupils are equal, round, and reactive to light. Right eye exhibits no discharge. Left eye exhibits no  discharge. No scleral icterus.  Neck: Normal range of motion. No JVD present. No tracheal deviation present.  Cardiovascular: Normal rate, regular rhythm, normal heart sounds and intact distal pulses.  Exam reveals no gallop.   No murmur heard. Pulmonary/Chest: Effort normal and breath sounds normal. No stridor. No respiratory distress. She has no wheezes. She has no rales. She exhibits no tenderness.  Abdominal: Soft. She exhibits no distension and no mass. There is no tenderness. There is no rebound and no guarding.  Musculoskeletal: Normal range of motion. She exhibits no edema or tenderness.  Neurological: She is alert and oriented to person, place, and time. She has normal strength. No cranial nerve deficit or sensory deficit. Coordination and gait normal. GCS eye subscore is 4. GCS verbal subscore is 5. GCS motor subscore is 6.  Reflex Scores:      Patellar reflexes are 2+ on the right side and 2+ on the left side. Skin: Skin is warm and dry. No rash noted. No erythema. No pallor.  Psychiatric: She has a normal mood and affect. Her behavior is normal. Judgment and thought content normal.  Nursing note and vitals reviewed.    ED Course  Procedures (including critical care time) Labs Review Labs Reviewed  I-STAT CHEM 8, ED - Abnormal; Notable for the following:    Sodium 128 (*)    Chloride 93 (*)    BUN 23 (*)    Glucose, Bld 113 (*)    All other components within normal limits    Imaging Review No results found. I have personally reviewed and evaluated these images and lab results as part of my medical decision-making.   EKG Interpretation   Date/Time:  Thursday August 12 2015 15:04:02 EST Ventricular Rate:  60 PR Interval:  177 QRS Duration: 88 QT Interval:  408 QTC Calculation: 408 R Axis:   48 Text Interpretation:  Sinus rhythm Consider left atrial enlargement No  previous tracing Confirmed by Maryan Rued  MD, Loree Fee ([id number]) on 08/12/2015  4:35:11 PM      MDM    Final diagnoses:  Acute nonintractable headache, unspecified headache type  Essential hypertension  Hyponatremia    Labs: i stat chem 8  Imaging:  Consults:  Therapeutics: toradol ,diphenhydramine, reglan  Discharge Meds:   Assessment/Plan: Patient presents with headache, hypertension. Patient admits that she did not take her hypertensive medication, at time of her evaluation in the ED she was found to have a systolic reading in the low 200s. Patient also has a headache, history of the same. Patient admits that she is extremely stressed and anxious, this is likely contributing to her hypertension and headache. Patient has no red flags for headache they've necessitate further imaging here in the ED. She was treated with Toradol, diphenhydramine and Compazine with symptomatic improvement. Patient was found to be hyponatremic, this  is not abnormal for her, uncertain etiology but is being closely followed by her primary care for evaluation. Patient has no neurological deficits, or significant signs or symptoms of hyponatremia. Patient will be discharged home with instructions to follow-up with her primary care provider tomorrow for reevaluation and further management of her ongoing conditions. Patient was given strict return precautions, she verbalized understanding and agreement today's plan and no further questions or concerns at time of discharge.        Okey Regal, PA-C 08/13/15 [id number]  Blanchie Dessert, MD 08/14/15 [phone]

## 2015-08-13 ENCOUNTER — Ambulatory Visit (INDEPENDENT_AMBULATORY_CARE_PROVIDER_SITE_OTHER): Payer: 59 | Admitting: Family Medicine

## 2015-08-13 VITALS — BP 152/68 | HR 74 | Temp 98.3°F | Resp 16 | Ht 66.0 in | Wt 179.0 lb

## 2015-08-13 DIAGNOSIS — R7303 Prediabetes: Secondary | ICD-10-CM | POA: Diagnosis not present

## 2015-08-13 DIAGNOSIS — E871 Hypo-osmolality and hyponatremia: Secondary | ICD-10-CM | POA: Diagnosis not present

## 2015-08-13 DIAGNOSIS — F411 Generalized anxiety disorder: Secondary | ICD-10-CM

## 2015-08-13 DIAGNOSIS — I10 Essential (primary) hypertension: Secondary | ICD-10-CM | POA: Diagnosis not present

## 2015-08-13 LAB — BASIC METABOLIC PANEL
BUN: 17 mg/dL (ref 7–25)
CALCIUM: 9.3 mg/dL (ref 8.6–10.4)
CO2: 24 mmol/L (ref 20–31)
CREATININE: 0.85 mg/dL (ref 0.50–0.99)
Chloride: 94 mmol/L — ABNORMAL LOW (ref 98–110)
GLUCOSE: 102 mg/dL — AB (ref 65–99)
POTASSIUM: 4.3 mmol/L (ref 3.5–5.3)
Sodium: 130 mmol/L — ABNORMAL LOW (ref 135–146)

## 2015-08-13 LAB — HEMOGLOBIN A1C
HEMOGLOBIN A1C: 5.8 % — AB (ref <5.7)
Mean Plasma Glucose: 120 mg/dL — ABNORMAL HIGH (ref <117)

## 2015-08-13 MED ORDER — LORAZEPAM 0.5 MG PO TABS: ORAL_TABLET | ORAL | Status: DC

## 2015-08-13 NOTE — Progress Notes (Signed)
[facility] [address], [place] [address] [phone]  Date:  08/13/2015   Name:  Sarah Rangel   DOB:  [birth year]   MRN:  [id number]  PCP:  Jenny Reichmann, MD    Chief Complaint: Blood pressure check   History of Present Illness:  Sarah Rangel is a 59 y.o. very pleasant female patient who presents with the following:  Here today to recheck HTN.  She was seen in the ER yesterday. She had a headache and her BP was too high- she got scared and went in to be seen. Her BP may have been so high because she accidentally did not take all of her BP medication- she forgot to take her procardia in the early am as she normally does.  She was feeling very anxious due to the holidays, etc. She was treated for her HA at the ER with toradol, benadryl and compazine and felt better. Sent home with plan to recheck today  Highest BP yesterday was 212/99  She has an appt to see me for a recheck in about 3 weeks  She plans to have a cystocele repaired in the spring.    She notes that her SBP tends to be 150- 160  She notes that anxiety is a big problem for her- she thinks this may contribute to her HTN.   She notes a mild HA today- 1/10.   She has been on the same BP medications for some time.   Her sodium was a little low yesterday- she did take 12.5 mg of HCTZ yesterday that she had leftover prior go going to the ER but this was her only dose of diuretics  She has tended to have some mild hyponatremia in the past- unknown cause  She has used metoprolol in the past- however she was concerned that this lowered her pulse She has tried taking a higher dose of nifedipine in the past but it caused swelling  She has used some ativan prn for anxiety in the past- last used this about 18 months ago.    BP Readings from Last 3 Encounters:  08/13/15 152/68  08/12/15 151/82  08/11/15 140/78     Patient Active Problem List   Diagnosis Date Noted  . Worsening headaches  12/05/2013  . Unspecified essential hypertension 11/06/2013  . Stress 11/06/2013    Past Medical History  Diagnosis Date  . History of cystocele   . Hypertension     Past Surgical History  Procedure Laterality Date  . Wisdom tooth extraction      age 89    Social History  Substance Use Topics  . Smoking status: Former Smoker    Types: Cigarettes  . Smokeless tobacco: Never Used  . Alcohol Use: 1.2 oz/week    2 Standard drinks or equivalent per week     Comment: occasionally 1 time monthly    Family History  Problem Relation Age of Onset  . Hypertension Father   . Colon cancer Neg Hx   . Pancreatic cancer Neg Hx   . Stomach cancer Neg Hx   . Brain cancer Mother   . Lymphoma Mother     No Known Allergies  Medication list has been reviewed and updated.  Current Outpatient Prescriptions on File Prior to Visit  Medication Sig Dispense Refill  . NIFEdipine (NIFEDICAL XL) 30 MG 24 hr tablet Take 1 tablet (30 mg total) by mouth 2 (two) times daily. 180 tablet 3  .  olmesartan (BENICAR) 40 MG tablet Take 1 tablet (40 mg total) by mouth daily. 90 tablet 3   No current facility-administered medications on file prior to visit.    Review of Systems:  As per HPI- otherwise negative.   Physical Examination: Filed Vitals:   08/13/15 0936  BP: 152/68  Pulse: 74  Temp: 98.3 F (36.8 C)  Resp: 16   Filed Vitals:   08/13/15 0936  Height: 5\' 6"  (1.676 m)  Weight: 179 lb (81.194 kg)   Body mass index is 28.91 kg/(m^2). Ideal Body Weight: Weight in (lb) to have BMI = 25: 154.6  GEN: WDWN, NAD, Non-toxic, A & O x 3, mild overweight, looks well HEENT: Atraumatic, Normocephalic. Neck supple. No masses, No LAD. Ears and Nose: No external deformity. CV: RRR, No M/G/R. No JVD. No thrill. No extra heart sounds. PULM: CTA B, no wheezes, crackles, rhonchi. No retractions. No resp. distress. No accessory muscle use. EXTR: No c/c/e NEURO Normal gait.  PSYCH: Normally  interactive. Conversant. Not depressed or anxious appearing.  Calm demeanor.   Results for orders placed or performed in visit on 08/13/15  Hemoglobin A1c  Result Value Ref Range   Hgb A1c MFr Bld 5.8 (H) <5.7 %   Mean Plasma Glucose 120 (H) <117 mg/dL  Basic metabolic panel  Result Value Ref Range   Sodium 130 (L) 135 - 146 mmol/L   Potassium 4.3 3.5 - 5.3 mmol/L   Chloride 94 (L) 98 - 110 mmol/L   CO2 24 20 - 31 mmol/L   Glucose, Bld 102 (H) 65 - 99 mg/dL   BUN 17 7 - 25 mg/dL   Creat 0.85 0.50 - 0.99 mg/dL   Calcium 9.3 8.6 - 10.4 mg/dL    Assessment and Plan: Essential hypertension  GAD (generalized anxiety disorder) - Plan: LORazepam (ATIVAN) 0.5 MG tablet  Pre-diabetes - Plan: Hemoglobin A1c  Hyponatremia - Plan: Basic metabolic panel  Check her A1c and BMP today She has some 25 mg of metrolol xl at home.  She reports that the tablets are scored and she has broken them in the past.  She will take just 12.5 mg once a day and see if her puse can tolerate this Also given rx for ativan to use on occasion She will see me next month for a recheck   Signed Lamar Blinks, MD

## 2015-08-13 NOTE — Patient Instructions (Signed)
We will recheck your sodium and A1c today Go ahead and start on 12.5 mg of metoprolol daily.   You can use the ativan as needed for anxiety- however remember this can be habit formin.  Use it on occasion as needed I will be in touch with your labs- we will look forward to seeing you next month

## 2015-08-15 NOTE — Progress Notes (Signed)
Encounter reviewed by Dr. Aundria Rud. OK to proceed with urodynamics.

## 2015-08-18 ENCOUNTER — Encounter: Payer: Self-pay | Admitting: Obstetrics and Gynecology

## 2015-08-18 ENCOUNTER — Ambulatory Visit (INDEPENDENT_AMBULATORY_CARE_PROVIDER_SITE_OTHER): Payer: 59 | Admitting: Obstetrics and Gynecology

## 2015-08-18 DIAGNOSIS — N812 Incomplete uterovaginal prolapse: Secondary | ICD-10-CM

## 2015-08-18 NOTE — Patient Instructions (Signed)
.   You may have a mild bladder and rectal discomfort for a few hours after the test. . You may experience some frequent urination and slight burning the first few times you urinate after the test. Rarely, the urine may be blood tinged. These are both due to catheter placements and resolve quickly.  . You should call our office immediately if you have signs of infection, which may include bladder pain, urinary urgency, fever, or burning during urination. .  We do encourage you to drink plenty of water after completion of the test today.   Please call our office if you have any concerns at [phone].

## 2015-08-18 NOTE — Progress Notes (Signed)
Sarah Rangel is a 59 y.o. female Who presents today for urodynamics testing, ordered by Dr. Quincy Simmonds.   Allergies and medications reviewed.  Denies complaints today. No urinary complaints.   Urine Micro exam: negative for WBC's or RBC's, okay to proceed per Dr. Quincy Simmonds.  Patient reports no urinary leakage with coughing, sneezing, exercise.    Urodynamics testing initiated. Lumax Bladder Catheter #10 [place] and lumax Abdominal Catheter #10 [place].   Post void residual 160 ml.   Urethral catheter placed without issue. Rectal catheter placed without issue.   Urodynamics testing completed. Please see scanned Patient summary report in Epic. Procedure completed and patient tolerated well without complaints. Patient scheduled for follow up office visit with Dr. Quincy Simmonds to discuss results. Patient agreeable.   Patient given post procedure instructions and verbalized understanding. Follow up with Dr. Quincy Simmonds is scheduled.   You may have a mild bladder and rectal discomfort for a few hours after the test. You may experience some frequent urination and slight burning the first few times you urinate after the test. Rarely, the urine may be blood tinged. These are both due to catheter placements and resolve quickly. You should call our office immediately if you have signs of infection, which may include bladder pain, urinary urgency, fever, or burning during urination. We do encourage you to drink plenty of water after the test.

## 2015-08-20 ENCOUNTER — Encounter: Payer: Self-pay | Admitting: Family Medicine

## 2015-08-20 DIAGNOSIS — I10 Essential (primary) hypertension: Secondary | ICD-10-CM

## 2015-08-21 MED ORDER — HYDRALAZINE HCL 10 MG PO TABS: 10.0000 mg | ORAL_TABLET | Freq: Four times a day (QID) | ORAL | Status: DC

## 2015-08-25 ENCOUNTER — Encounter: Payer: Self-pay | Admitting: Obstetrics and Gynecology

## 2015-08-25 NOTE — Progress Notes (Signed)
Multichannel urodynamic testing:  Pessary placed to reduce prolapse from almost third degree cystocele.  Uroflow - Void 200 cc, PVR 160 cc.  Intermittent flow.  CMG - S1 - 208 cc, S2 429 cc, S3 800 cc.  Max capacity 848.4 cc.  VLPP - no leakage.  Stable CMG. UCP - 42 cm H2O. Pressure flow study - P det max 55 cm H2O.   Voided 992 cc.  No evidence of genuine stress incontinence with reduction of prolapse.

## 2015-08-27 ENCOUNTER — Encounter: Payer: Self-pay | Admitting: Obstetrics and Gynecology

## 2015-08-27 ENCOUNTER — Ambulatory Visit (INDEPENDENT_AMBULATORY_CARE_PROVIDER_SITE_OTHER): Payer: 59 | Admitting: Obstetrics and Gynecology

## 2015-08-27 VITALS — BP 160/84 | HR 76 | Ht 66.0 in | Wt 174.4 lb

## 2015-08-27 DIAGNOSIS — N812 Incomplete uterovaginal prolapse: Secondary | ICD-10-CM

## 2015-08-27 NOTE — Progress Notes (Signed)
Patient ID: Sarah Rangel, female   DOB: 1956/05/12, 59 y.o.   MRN: [id number] GYNECOLOGY  VISIT   HPI: 59 y.o.   Married  Caucasian  female   G2P2002 with Patient's last menstrual period was 09/18/2008 (approximate).   here to discuss results of urodynamics testing and surgical care for prolapse.  Has an appointment for her annual exam with me next week.   Has tried physical therapy and pessary.   No straining to have bowel movements.  No real leak of urine.   Interested to have ovaries removed.  Hx abnormal pap.   Multichannel urodynamic testing: Pessary placed to reduce prolapse from almost third degree cystocele.  Uroflow - Void 200 cc, PVR 160 cc. Intermittent flow.  CMG - S1 - 208 cc, S2 429 cc, S3 800 cc. Max capacity 848.4 cc. VLPP - no leakage. Stable CMG. UCP - 42 cm H2O. Pressure flow study - P det max 55 cm H2O. Voided 992 cc.  No evidence of genuine stress incontinence with reduction of prolapse.   Physical exam shows third - fourth degree cystocele, second degree uterine prolapse, first degree rectocele.  GYNECOLOGIC HISTORY: Patient's last menstrual period was 09/18/2008 (approximate). Contraception:Vasectomy/postmenopausal Menopausal hormone therapy: none Last mammogram: 04-13-15 Density Cat.B/Neg/BiRads1:[facility]. Last pap smear: 08-19-14 Neg:Neg HR HPV        OB History    Gravida Para Term Preterm AB TAB SAB Ectopic Multiple Living   2 2 2       2          Patient Active Problem List   Diagnosis Date Noted  . Worsening headaches 12/05/2013  . Unspecified essential hypertension 11/06/2013  . Stress 11/06/2013    Past Medical History  Diagnosis Date  . History of cystocele   . Hypertension     Past Surgical History  Procedure Laterality Date  . Wisdom tooth extraction      age 1    Current Outpatient Prescriptions  Medication Sig Dispense Refill  . hydrALAZINE (APRESOLINE) 10 MG tablet Take 1 tablet (10 mg total) by  mouth 4 (four) times daily. (Patient taking differently: Take 10 mg by mouth 3 (three) times daily. ) 120 tablet 3  . LORazepam (ATIVAN) 0.5 MG tablet Take 1/2 or 1 twice daily as needed for occasional anxiety 30 tablet 0  . NIFEdipine (NIFEDICAL XL) 30 MG 24 hr tablet Take 1 tablet (30 mg total) by mouth 2 (two) times daily. 180 tablet 3  . olmesartan (BENICAR) 40 MG tablet Take 1 tablet (40 mg total) by mouth daily. 90 tablet 3   No current facility-administered medications for this visit.     ALLERGIES: Review of patient's allergies indicates no known allergies.  Family History  Problem Relation Age of Onset  . Hypertension Father   . Colon cancer Neg Hx   . Pancreatic cancer Neg Hx   . Stomach cancer Neg Hx   . Brain cancer Mother   . Lymphoma Mother     Social History   Social History  . Marital Status: Married    Spouse Name: N/A  . Number of Children: N/A  . Years of Education: N/A   Occupational History  . Not on file.   Social History Main Topics  . Smoking status: Former Smoker    Types: Cigarettes  . Smokeless tobacco: Never Used  . Alcohol Use: 1.2 oz/week    2 Standard drinks or equivalent per week     Comment: occasionally 1 time  monthly  . Drug Use: No  . Sexual Activity:    Partners: Male    Birth Control/ Protection: Other-see comments, Post-menopausal     Comment: vasectomy   Other Topics Concern  . Not on file   Social History Narrative    ROS:  Pertinent items are noted in HPI.  PHYSICAL EXAMINATION:    BP 160/84 mmHg  Pulse 76  Ht 5\' 6"  (1.676 m)  Wt 174 lb 6.4 oz (79.107 kg)  BMI 28.16 kg/m2  LMP 09/18/2008 (Approximate)    General appearance: alert, cooperative and appears stated age   ASSESSMENT  Incomplete uterovaginal prolapse. Large bladder volume.  Some straining on pressure flow study.  No stress incontinence on urodynamic teseting.  Hx abnormal paps.  Pap in 2015 negative with negative HR HPV.  PLAN  Discussion of  urodynamic testing with patient.  Discussion of potential surgical procedure:  Total abdominal hysterectomy with bilateral salpingo-oophorectomy, abdominal sacrocolpopexy with permanent graft, Halban's culdoplasty, anterior and possible posterior colporrhaphy, TVT Exact midurethral sling with permanent mesh, and cystoscopy. I discussed with the patient the rationale for doing the midurethral sling (or a Burch procedure) based on her risk of developing future stress incontinence following a sacrocolpopexy procedure. We discussed benefits and risks of surgery which include but are not limited to bleeding, infection, damage to surrounding organs, ureteral damage, vaginal pain with intercourse, permanent mesh use which may cause erosion and exposure in the vagina, urethra, bladder or ureters, dyspareunia, slower voiding and urinary retention, possible need for prolonged catheterization and/or self catheterization, de novo overactive bladder symptoms, reoperation, recurrence of prolapse and incontinence,  DVT, PE, death, and reaction to anesthesia.    I have discussed surgical expectations regarding the procedures and success rates, outcomes, and recovery.     Patient is interested to pursue surgery.   Return for annual exam next week.    An After Visit Summary was printed and given to the patient.  ___40___ minutes face to face time of which over 50% was spent in counseling.

## 2015-09-01 ENCOUNTER — Ambulatory Visit (INDEPENDENT_AMBULATORY_CARE_PROVIDER_SITE_OTHER): Payer: 59 | Admitting: Obstetrics and Gynecology

## 2015-09-01 ENCOUNTER — Telehealth: Payer: Self-pay

## 2015-09-01 ENCOUNTER — Encounter: Payer: Self-pay | Admitting: Obstetrics and Gynecology

## 2015-09-01 ENCOUNTER — Encounter: Payer: Self-pay | Admitting: Family Medicine

## 2015-09-01 ENCOUNTER — Telehealth: Payer: Self-pay | Admitting: Internal Medicine

## 2015-09-01 ENCOUNTER — Ambulatory Visit (INDEPENDENT_AMBULATORY_CARE_PROVIDER_SITE_OTHER): Payer: 59 | Admitting: Family Medicine

## 2015-09-01 VITALS — BP 165/85 | HR 90 | Temp 98.9°F | Resp 16 | Ht 66.0 in | Wt 174.8 lb

## 2015-09-01 DIAGNOSIS — K625 Hemorrhage of anus and rectum: Secondary | ICD-10-CM

## 2015-09-01 DIAGNOSIS — N812 Incomplete uterovaginal prolapse: Secondary | ICD-10-CM | POA: Diagnosis not present

## 2015-09-01 DIAGNOSIS — I10 Essential (primary) hypertension: Secondary | ICD-10-CM | POA: Diagnosis not present

## 2015-09-01 DIAGNOSIS — N760 Acute vaginitis: Secondary | ICD-10-CM

## 2015-09-01 DIAGNOSIS — R634 Abnormal weight loss: Secondary | ICD-10-CM

## 2015-09-01 DIAGNOSIS — N63 Unspecified lump in breast: Secondary | ICD-10-CM | POA: Diagnosis not present

## 2015-09-01 DIAGNOSIS — Z01419 Encounter for gynecological examination (general) (routine) without abnormal findings: Secondary | ICD-10-CM | POA: Diagnosis not present

## 2015-09-01 DIAGNOSIS — N6320 Unspecified lump in the left breast, unspecified quadrant: Secondary | ICD-10-CM

## 2015-09-01 LAB — T3, FREE: T3, Free: 3 pg/mL (ref 2.3–4.2)

## 2015-09-01 LAB — TSH: TSH: 1.807 u[IU]/mL (ref 0.350–4.500)

## 2015-09-01 NOTE — Telephone Encounter (Signed)
Spoke with the Triage nurse at Dr.Perry's office. The earliest appointment available she can move the patient up to is January 19th at 9:15 am with Dr.Perry. States there are no earlier appointments with any other provider. Appointment scheduled for 10/07/2015.   Dr.Silva, would you like me to place a referral to Dr.Mann to see if we can obtain an earlier appointment for the patient?

## 2015-09-01 NOTE — Progress Notes (Signed)
Scheduled patient while in office for left breast diagnostic mammogram with ultrasound at the [facility] on 09/08/2015 at 9:10 am. Patient is agreeable to date and time. Unable to schedule appointment with Dr.Perry's office at this time as their office is closed for a staff meeting. Patient is aware I will continue calling Dr.Perry's office to schedule appointment and call her with an appointment date and time.

## 2015-09-01 NOTE — Telephone Encounter (Signed)
OK to be seen by Dr. Collene Mares! Thank you.

## 2015-09-01 NOTE — Progress Notes (Signed)
Patient ID: Sarah Rangel, female   DOB: Jan 02, 1956, 59 y.o.   MRN: [id number] 59 y.o. G46P2002 Married Caucasian female here for annual exam.    Having some discharge and a sore on the outside.  Not sure if this is due to the prolapse or not.  Notes that she has soreness around the rectum.  Has recurrent rectal bleeding with bowel movements at least once per week.   HgbA1C 5.8 two weeks ago with PCP.  Was 6.2 in June 2016.  Lost 30 pounds over the last year.  Has cut down on carbohydrates.   Sees PCP today for blood pressure check.  Feels tachycardia increasing at medical office visits.   Planning future surgery for prolapse.   PCP:  Lamar Blinks, MD   Patient's last menstrual period was 09/18/2008 (approximate).          Sexually active: Yes.   female The current method of family planning is vasectomy.    Exercising: Yes.    swimming and spin class. Smoker:  no  Health Maintenance: Pap:  08-19-14 Neg:Neg HR HPV History of abnormal Pap:  Yes, Hx of Ascus paps but HPV neg. MMG:  04-13-15 density Cat.B/Neg/BiRads1:[facility]. Colonoscopy:  10-30-13 normal with Dr. Monna Fam 2025. BMD:   06-29-10   Result  Normal with [facility]:  03-16-15 Screening Labs:  Hb today: PCP, Urine today: unable to void.   reports that she has quit smoking. Her smoking use included Cigarettes. She has never used smokeless tobacco. She reports that she drinks about 1.2 oz of alcohol per week. She reports that she does not use illicit drugs.  Past Medical History  Diagnosis Date  . History of cystocele   . Hypertension     Past Surgical History  Procedure Laterality Date  . Wisdom tooth extraction      age 58    Current Outpatient Prescriptions  Medication Sig Dispense Refill  . hydrALAZINE (APRESOLINE) 10 MG tablet Take 1 tablet (10 mg total) by mouth 4 (four) times daily. (Patient taking differently: Take 10 mg by mouth 3 (three) times daily. ) 120 tablet 3  . LORazepam  (ATIVAN) 0.5 MG tablet Take 1/2 or 1 twice daily as needed for occasional anxiety 30 tablet 0  . NIFEdipine (NIFEDICAL XL) 30 MG 24 hr tablet Take 1 tablet (30 mg total) by mouth 2 (two) times daily. 180 tablet 3  . olmesartan (BENICAR) 40 MG tablet Take 1 tablet (40 mg total) by mouth daily. 90 tablet 3   No current facility-administered medications for this visit.    Family History  Problem Relation Age of Onset  . Hypertension Father   . Colon cancer Neg Hx   . Pancreatic cancer Neg Hx   . Stomach cancer Neg Hx   . Brain cancer Mother   . Lymphoma Mother     ROS:  Pertinent items are noted in HPI.  Otherwise, a comprehensive ROS was negative.  Exam:   LMP 09/18/2008 (Approximate)    General appearance: alert, cooperative and appears stated age Head: Normocephalic, without obvious abnormality, atraumatic Neck: no adenopathy, supple, symmetrical, trachea midline and thyroid normal to inspection and palpation Lungs: clear to auscultation bilaterally Breasts: Inspection negative, No nipple retraction or dimpling, No nipple discharge or bleeding, No axillary or supraclavicular adenopathy, left breast mass at 2:00 - 1 cm cyst?.  right breast no lesions.  Heart: regular rate and rhythm Abdomen: soft, non-tender; bowel sounds normal; no masses,  no  organomegaly Extremities: extremities normal, atraumatic, no cyanosis or edema Skin: Skin color, texture, turgor normal. No rashes or lesions Lymph nodes: Cervical, supraclavicular, and axillary nodes normal. No abnormal inguinal nodes palpated Neurologic: Grossly normal  Pelvic: External genitalia:  no lesions              Urethra:  normal appearing urethra with no masses, tenderness or lesions              Bartholins and Skenes: normal                 Vagina: normal appearing vagina with normal color and discharge, no lesions              Cervix: no lesions              Pap taken: No. Bimanual Exam:  Uterus:  normal size, contour,  position, consistency, mobility, non-tender.  Third - fourth degree cystocele, second degree uterine prolapse, first degree rectocele.              Adnexa: normal adnexa and no mass, fullness, tenderness              Rectovaginal: Yes.  .  Confirms.              Anus:  normal sphincter tone, no lesions  Chaperone was present for exam.  Assessment:   Well woman visit. Left breast mass. Pelvic organ prolapse.  Incomplete uterovaginal prolapse. Vaginitis.  Hx ASCUS paps and negative HR HPV.  No pap needed today.  Recurrent rectal bleeding.  Fissure versus hemorrhoid.   Plan: Yearly mammogram recommended after age 76.   Will schedule dx left mammogram and left breast ultrasound.  Referral back to GI.  Recommended self breast exam.  Pap and HR HPV as above. Discussed Calcium, Vitamin D, regular exercise program including cardiovascular and weight bearing exercise. Labs performed.  Yes.  Affirm.  Refills given on medications.  No..  Future TAH/BSO/abdominosacrocolpopexy/Hallban's culdoplasty/TVT midurethral sling/cystoscopy/anterior and possible posterior colporrhaphy anticipated. Follow up annually and prn.   After visit summary provided.

## 2015-09-01 NOTE — Progress Notes (Signed)
[facility] [address], [place] [address] [phone]  Date:  09/01/2015   Name:  Sarah Rangel   DOB:  June 05, 1956   MRN:  [id number]  PCP:  Lamar Blinks, MD    Chief Complaint: Follow-up   History of Present Illness:  Sarah Rangel is a 59 y.o. very pleasant female patient who presents with the following:  Here today to follow-up on her HTN- I last saw her about 3 weeks ago at which time we planned to try a low dose of metoprolol- however she was not able to tolerate this and we decided to use hydralazine instead. She is also on nifedipine and benicar.  Can not tolerate BB very well and her Na has been low so we have not tried a diuretic.    Her home BP generally run 150/ 70-80.    She tried the hydralazine TID so far  She does have pre-DM and is diet controlled  Her GYN DR. Silva plans to do surgery for her pelvic organ prolapse.  She will have a complete hyst at the same time.   She will have a mammo next week for a possible cyst in the left breast.    She has had a renal artery doppler which was negative She notes that she does have some health care associated anxiety and that her BP seems to go up every time she goes near an MD office She swims for exercise and has lost about 30 lbs on purpose.  In general she feels great!   Wt Readings from Last 3 Encounters:  09/01/15 174 lb 12.8 oz (79.289 kg)  08/27/15 174 lb 6.4 oz (79.107 kg)  08/18/15 176 lb 9.6 oz (80.105 kg)     BP Readings from Last 3 Encounters:  09/01/15 187/73  08/27/15 160/84  08/18/15 148/90     Patient Active Problem List   Diagnosis Date Noted  . Worsening headaches 12/05/2013  . Unspecified essential hypertension 11/06/2013  . Stress 11/06/2013    Past Medical History  Diagnosis Date  . History of cystocele   . Hypertension     Past Surgical History  Procedure Laterality Date  . Wisdom tooth extraction      age 81    Social History  Substance  Use Topics  . Smoking status: Former Smoker    Types: Cigarettes  . Smokeless tobacco: Never Used  . Alcohol Use: 1.2 oz/week    2 Standard drinks or equivalent per week     Comment: occasionally 1 time monthly    Family History  Problem Relation Age of Onset  . Hypertension Father   . Colon cancer Neg Hx   . Pancreatic cancer Neg Hx   . Stomach cancer Neg Hx   . Brain cancer Mother   . Lymphoma Mother     No Known Allergies  Medication list has been reviewed and updated.  Current Outpatient Prescriptions on File Prior to Visit  Medication Sig Dispense Refill  . hydrALAZINE (APRESOLINE) 10 MG tablet Take 1 tablet (10 mg total) by mouth 4 (four) times daily. (Patient taking differently: Take 10 mg by mouth 3 (three) times daily. ) 120 tablet 3  . LORazepam (ATIVAN) 0.5 MG tablet Take 1/2 or 1 twice daily as needed for occasional anxiety 30 tablet 0  . NIFEdipine (NIFEDICAL XL) 30 MG 24 hr tablet Take 1 tablet (30 mg total) by mouth 2 (two) times daily. 180 tablet 3  . olmesartan (  BENICAR) 40 MG tablet Take 1 tablet (40 mg total) by mouth daily. 90 tablet 3   No current facility-administered medications on file prior to visit.    Review of Systems:  As per HPI- otherwise negative.   Physical Examination: Filed Vitals:   09/01/15 1353 09/01/15 1357  BP: 198/95 187/73  Pulse: 111   Temp: 98.9 F (37.2 C)   Resp: 16    Filed Vitals:   09/01/15 1353  Height: 5\' 6"  (1.676 m)  Weight: 174 lb 12.8 oz (79.289 kg)   Body mass index is 28.23 kg/(m^2). Ideal Body Weight: Weight in (lb) to have BMI = 25: 154.6  GEN: WDWN, NAD, Non-toxic, A & O x 3, looks well, mild overweight HEENT: Atraumatic, Normocephalic. Neck supple. No masses, No LAD. Ears and Nose: No external deformity. CV: RRR, No M/G/R. No JVD. No thrill. No extra heart sounds. PULM: CTA B, no wheezes, crackles, rhonchi. No retractions. No resp. distress. No accessory muscle use. EXTR: No c/c/e NEURO Normal  gait.  PSYCH: Normally interactive. Conversant. Not depressed or anxious appearing.  Calm demeanor.    Assessment and Plan: Essential hypertension - Plan: Basic metabolic panel  Loss of weight - Plan: TSH, T3, Free  Her HTN has been hard to control but she does have a lot of health care related anxiety. Her BP was better with repeated readings.  Will increase her hydralazine to 20 TID and she will let me know how her BP looks  Will make sure she is not hyperthyroid as she has lost weight (albeit on purpose!)  Discussed relaxation techniques for her  Signed Lamar Blinks, MD

## 2015-09-01 NOTE — Telephone Encounter (Signed)
Spoke with Colletta Maryland at Dr.Perry's office. Colletta Maryland states the first available appointment with Dr.Perry or a PA is not until February. Advised patient will need to be seen before February for evaluation. Needs to be seen ASAP due to rectal bleeding once per week with rectal soreness. Need to rule out fissure versus hemorrhoid. Colletta Maryland is agreeable and states she will send a message to the triage nurse for her to return call to the office.

## 2015-09-01 NOTE — Telephone Encounter (Signed)
Pts appt moved to 10/07/15@9 :15am, Kaitlyn to notify pt of appt.

## 2015-09-01 NOTE — Patient Instructions (Addendum)
I will be in touch with your labs asap Try increasing your hydralazine to 20 mg three times a day.   Please let me know how your blood pressure looks   Try to work on a daily meditation practice to see if it may help you to relax.  Counseling is also an idea to consider

## 2015-09-01 NOTE — Telephone Encounter (Signed)
Spoke with patient. Advised of first available appointment with Dr.Perry's office and recommendation for her to be seen earlier for evaluation. Patient is agreeable to be seen with Dr.Mann at an earlier date if possible. Referral placed to Dr.Mann's office. Advised I will return call with appointment date and time. Patient is agreeable.  Called Dr.Mann's office. Prior to scheduling Dr.Mann will need to review patient's prior colonoscopy. Call to patient to advise records from Dr.Perry's office will need to be faxed to Dr.Mann for review prior to scheduling. Patient is agreeable and will have these records faxed to Dr.Mann at [phone]. Aware they will contact her to schedule. Advised I will follow up with Dr.Mann's office tomorrow to ensure she is scheduled. Patient is agreeable.

## 2015-09-01 NOTE — Patient Instructions (Signed)

## 2015-09-02 ENCOUNTER — Other Ambulatory Visit: Payer: Self-pay | Admitting: Obstetrics and Gynecology

## 2015-09-02 LAB — WET PREP BY MOLECULAR PROBE
CANDIDA SPECIES: NEGATIVE
Gardnerella vaginalis: POSITIVE — AB
TRICHOMONAS VAG: NEGATIVE

## 2015-09-02 LAB — BASIC METABOLIC PANEL
BUN: 16 mg/dL (ref 7–25)
CALCIUM: 9.7 mg/dL (ref 8.6–10.4)
CO2: 27 mmol/L (ref 20–31)
CREATININE: 0.88 mg/dL (ref 0.50–0.99)
Chloride: 96 mmol/L — ABNORMAL LOW (ref 98–110)
GLUCOSE: 105 mg/dL — AB (ref 65–99)
Potassium: 4.2 mmol/L (ref 3.5–5.3)
Sodium: 132 mmol/L — ABNORMAL LOW (ref 135–146)

## 2015-09-02 MED ORDER — METRONIDAZOLE 500 MG PO TABS: 500.0000 mg | ORAL_TABLET | Freq: Two times a day (BID) | ORAL | Status: DC

## 2015-09-02 NOTE — Telephone Encounter (Signed)
Spoke with Dr.Mann's office. Patient was seen this morning with Dr.Mann. OV note will be sent to Dr.Silva for patient's chart.  Routing to provider for final review. Patient agreeable to disposition. Will close encounter.

## 2015-09-08 ENCOUNTER — Ambulatory Visit
Admission: RE | Admit: 2015-09-08 | Discharge: 2015-09-08 | Disposition: A | Discharge status: Home / Self Care | Payer: 59 | Source: Ambulatory Visit | Attending: Obstetrics and Gynecology | Admitting: Obstetrics and Gynecology

## 2015-09-08 ENCOUNTER — Telehealth: Payer: Self-pay | Admitting: Obstetrics and Gynecology

## 2015-09-08 DIAGNOSIS — N6320 Unspecified lump in the left breast, unspecified quadrant: Secondary | ICD-10-CM

## 2015-09-08 NOTE — Telephone Encounter (Signed)
Spoke with patient regarding benefits for surgery. Patient understood and agreeable. Patient aware professional estimated benefit only. Patient aware hospital will call with separate benefits. Patient aware of cancellation policy. Patient states she prefers to wait until 09/21/15 to provide deposit and schedule. Patient aware remainder of benefit due policy.   Patient requests to speak with Gay Filler to obtain possible surgery dates so she may plan accordingly. Patient agreeable to return call.  Routing to [place] for review and return call to patient.

## 2015-09-09 ENCOUNTER — Encounter: Payer: Self-pay | Admitting: Family Medicine

## 2015-09-09 DIAGNOSIS — I10 Essential (primary) hypertension: Secondary | ICD-10-CM

## 2015-09-09 NOTE — Telephone Encounter (Signed)
Return call to patient. States she has decided she would like to proceed with surgery earlier than previously planned for March 2017. Interested in late January or early February. Date options discussed. Patient considering options. Will call back at beginning of year with final decision.    Routing to provider for final review. Patient agreeable to disposition. Will close encounter.    Routing to Dr Talbert Nan covering for Dr Quincy Simmonds.

## 2015-09-10 ENCOUNTER — Telehealth: Payer: Self-pay | Admitting: Obstetrics and Gynecology

## 2015-09-10 MED ORDER — HYDRALAZINE HCL 10 MG PO TABS: 20.0000 mg | ORAL_TABLET | Freq: Three times a day (TID) | ORAL | Status: DC

## 2015-09-10 NOTE — Telephone Encounter (Signed)
Patient has questions about surgery dates.

## 2015-09-10 NOTE — Telephone Encounter (Signed)
Patient calling to let us know she would like date sooner that 11-02-15 if anyone cancels and earlier date becomes available.  Advised I will let her know if earlier date becomes available.  Routing to provider for final review. Dr Talbert Nan covering for Dr Quincy Simmonds. Will close encounter.

## 2015-09-20 MED FILL — NIFEDICAL XL 30 MG TABLET: 30 | 90 days supply | Qty: 180 | Fill #2

## 2015-09-21 ENCOUNTER — Telehealth: Payer: Self-pay | Admitting: Obstetrics and Gynecology

## 2015-09-21 NOTE — Telephone Encounter (Signed)
Return call to patient. Desires to proceed with first available surgery date. Advised 11-02-15 is first available and can proceed with this date. Will scheduled and call patient back.

## 2015-09-21 NOTE — Telephone Encounter (Signed)
Patient calling to speak with Gay Filler about scheduling surgery.

## 2015-09-21 NOTE — Telephone Encounter (Signed)
See staff message

## 2015-09-23 NOTE — Telephone Encounter (Signed)
Patient paid surgery deposit and is ready to schedule surgery.

## 2015-09-26 ENCOUNTER — Encounter: Payer: Self-pay | Admitting: Obstetrics and Gynecology

## 2015-09-27 ENCOUNTER — Telehealth: Payer: Self-pay

## 2015-09-27 NOTE — Telephone Encounter (Signed)
Telephone encounter created for Dr.Silva's review and to [name], RN for scheduling.

## 2015-09-27 NOTE — Telephone Encounter (Signed)
Call to patient, left message to call back. Left message confirming 11-02-15 date and that we do need additional appointments which we can schedule when she calls back.

## 2015-09-27 NOTE — Telephone Encounter (Signed)
Thank you for the update.  Cc- Sally Yeakley 

## 2015-09-27 NOTE — Telephone Encounter (Signed)
Case was posted on 09-24-15. See next phone encounter.  Routing to provider for final review. Will close encounter.

## 2015-09-27 NOTE — Telephone Encounter (Signed)
Non-Urgent Medical Question  Message [id number]   From  [place], [place]   Sent  09/26/2015 10:08 AM     Dr Quincy Simmonds,   I paid my $250 deposit for surgery on 09/23/15.   I would like to confirm my surgery date of 11/02/15.  I would also like to know if there are any other appointments or information I need prior to surgery.  I look forward to hearing from you.   Thanks,   Sarah Rangel      Responsible Party    Pool - [facility] Pool No one has taken responsibility for this message.     No actions have been taken on this message.     Routing to Dr.Silva as FYI and [name], RN for scheduling.

## 2015-09-28 ENCOUNTER — Encounter: Payer: Self-pay | Admitting: Emergency Medicine

## 2015-09-28 NOTE — Telephone Encounter (Signed)
Patient returned call

## 2015-09-28 NOTE — Telephone Encounter (Signed)
Message left to return call to [place] at [phone].   Can speak with Gay Filler or Olivia Mackie.  Needs Pre-op appointment, one week post op appointment, 6 week post op and bowel prep: Clear liquids only after breakfast the day before and fleets enema the night before.

## 2015-09-28 NOTE — Telephone Encounter (Signed)
Patient calling to speak with Sally about surgery. °

## 2015-09-29 ENCOUNTER — Encounter: Payer: Self-pay | Admitting: Emergency Medicine

## 2015-09-29 NOTE — Telephone Encounter (Signed)
Call to patient. Message left to return call to [place] at [phone].  Advised both Gay Filler and myself will be unavailable this morning after 11. Advised she can call us and let us know a good time to call her back or we will try to reach her again this afternoon.

## 2015-09-29 NOTE — Telephone Encounter (Signed)
Patient returning your call.

## 2015-09-29 NOTE — Telephone Encounter (Signed)
Call to patient for surgical instructions. Surgery is scheduled for 11/02/15 at 0715 arrive 90 minutes prior at 0600.   Beginning 10 days before surgery, we ask that you do not take the following medications:  Aspirin, Vitamin E, NSAIDS (like Advil, Aleve, Motrin, Ibuprofen), Fish Oil, Herbal supplements.  You can take Tylenol or acetaminophen for any discomfort.  Do not take any other pain medications unless approved by your physician.  You may continue your regular multi-vitamin.   Bowel prep-Light breakfast on 11/01/15 then clear liquids only, place one fleets enema rectally the night before surgery.   DO NOT EAT OR DRINK ANYTHING AFTER MIDNIGHT ON THE NIGHT BEFORE YOUR SURGERY (INCLUDING WATER) UNLESS ADVISED TO DO SO BY THE HOSPITAL OR YOUR PHYSICIAN.  Dress casually the morning of surgery.  Do not take any valuables with you and do not wear makeup, jewelry or lotion.  You should not shave 48 hours prior to surgery.  Pre op appointment scheduled for 10/14/15 at 1430  2 week post op appointment scheduled for 11/08/15 at 12:45  6 week post op 12/13/15 at 1245.   Patient advised regarding pre-op planning: Dress casually the morning of surgery.  Do not bring any valuables with you into the hospital. Do not wear makeup, lotions, powders or finger nail polish. You may wear deodorant. You should not shave 48 hours prior to surgery. Do not wear any jewelry or metal in your hair or on your body. Contacts, dentures or bridgework may not be worn into surgery.  Patient verbalized understanding of instructions and appointments scheduled.  Patient has FMLA paperwork she would like Dr. Quincy Simmonds to complete. Advised to contact our office when she has paperwork ready for completion.

## 2015-09-29 NOTE — Telephone Encounter (Signed)
Patient returning call. Anytime this afternoon is fine to reach her.

## 2015-10-03 ENCOUNTER — Encounter: Payer: Self-pay | Admitting: Family Medicine

## 2015-10-07 ENCOUNTER — Ambulatory Visit: Payer: 59 | Admitting: Internal Medicine

## 2015-10-08 ENCOUNTER — Encounter: Payer: Self-pay | Admitting: Family Medicine

## 2015-10-11 ENCOUNTER — Telehealth: Payer: Self-pay | Admitting: Obstetrics and Gynecology

## 2015-10-11 NOTE — Telephone Encounter (Signed)
Called patient and left a message to call back and ask for Sarah Rangel.

## 2015-10-13 ENCOUNTER — Telehealth: Payer: Self-pay | Admitting: Obstetrics and Gynecology

## 2015-10-13 ENCOUNTER — Encounter: Payer: Self-pay | Admitting: Family Medicine

## 2015-10-13 DIAGNOSIS — Z0289 Encounter for other administrative examinations: Secondary | ICD-10-CM

## 2015-10-13 NOTE — Telephone Encounter (Signed)
Called patient to discuss benefits for a procedure. Left Voicemail requesting a call back. °

## 2015-10-13 NOTE — Patient Instructions (Addendum)
Your procedure is scheduled on:  Tuesday, Feb. 14, 2017  Enter through the Main Entrance of [facility] at:  6:00 A.M.  Pick up the phone at the desk and dial [phone].  Call this number if you have problems the morning of surgery: [phone].  Remember: Do NOT eat food or drink after:  Midnight Monday, Feb. 13, 2017  Take these medicines the morning of surgery with a SIP OF WATER:  Hydralazine, Nifedipine, Benicar, Ativan if needed  Do NOT wear jewelry (body piercing), metal hair clips/bobby pins, make-up, or nail polish. Do NOT wear lotions, powders, or perfumes.  You may wear deoderant. Do NOT shave for 48 hours prior to surgery. Do NOT bring valuables to the hospital. Contacts, dentures, or bridgework may not be worn into surgery.  Leave suitcase in car.  After surgery it may be brought to your room.  For patients admitted to the hospital, checkout time is 11:00 AM the day of discharge.

## 2015-10-14 ENCOUNTER — Encounter (HOSPITAL_COMMUNITY): Payer: Self-pay

## 2015-10-14 ENCOUNTER — Encounter: Payer: Self-pay | Admitting: Obstetrics and Gynecology

## 2015-10-14 ENCOUNTER — Encounter (HOSPITAL_COMMUNITY)
Admission: RE | Admit: 2015-10-14 | Discharge: 2015-10-14 | Disposition: A | Discharge status: Home / Self Care | Payer: 59 | Source: Ambulatory Visit | Attending: Obstetrics and Gynecology | Admitting: Obstetrics and Gynecology

## 2015-10-14 ENCOUNTER — Ambulatory Visit (INDEPENDENT_AMBULATORY_CARE_PROVIDER_SITE_OTHER): Payer: 59 | Admitting: Obstetrics and Gynecology

## 2015-10-14 ENCOUNTER — Inpatient Hospital Stay (HOSPITAL_COMMUNITY)
Admission: RE | Admit: 2015-10-14 | Discharge: 2015-10-14 | Disposition: A | Discharge status: Home / Self Care | Payer: 59 | Source: Ambulatory Visit

## 2015-10-14 VITALS — BP 180/98 | HR 76 | Ht 66.0 in | Wt 173.0 lb

## 2015-10-14 DIAGNOSIS — N813 Complete uterovaginal prolapse: Secondary | ICD-10-CM | POA: Insufficient documentation

## 2015-10-14 DIAGNOSIS — Z79899 Other long term (current) drug therapy: Secondary | ICD-10-CM | POA: Diagnosis not present

## 2015-10-14 DIAGNOSIS — R030 Elevated blood-pressure reading, without diagnosis of hypertension: Secondary | ICD-10-CM | POA: Insufficient documentation

## 2015-10-14 DIAGNOSIS — F419 Anxiety disorder, unspecified: Secondary | ICD-10-CM | POA: Diagnosis not present

## 2015-10-14 DIAGNOSIS — Z01812 Encounter for preprocedural laboratory examination: Secondary | ICD-10-CM | POA: Insufficient documentation

## 2015-10-14 HISTORY — DX: Anxiety disorder, unspecified: F41.9

## 2015-10-14 LAB — COMPREHENSIVE METABOLIC PANEL
ALBUMIN: 5 g/dL (ref 3.5–5.0)
ALT: 24 U/L (ref 14–54)
ANION GAP: 10 (ref 5–15)
AST: 19 U/L (ref 15–41)
Alkaline Phosphatase: 50 U/L (ref 38–126)
BILIRUBIN TOTAL: 0.9 mg/dL (ref 0.3–1.2)
BUN: 18 mg/dL (ref 6–20)
CO2: 23 mmol/L (ref 22–32)
Calcium: 9.6 mg/dL (ref 8.9–10.3)
Chloride: 99 mmol/L — ABNORMAL LOW (ref 101–111)
Creatinine, Ser: 0.8 mg/dL (ref 0.44–1.00)
GFR calc Af Amer: >60 mL/min (ref >60)
GFR calc non Af Amer: >60 mL/min (ref >60)
GLUCOSE: 121 mg/dL — AB (ref 65–99)
POTASSIUM: 4.2 mmol/L (ref 3.5–5.1)
SODIUM: 132 mmol/L — AB (ref 135–145)
TOTAL PROTEIN: 7.6 g/dL (ref 6.5–8.1)

## 2015-10-14 LAB — CBC
HCT: 38.5 % (ref 36.0–46.0)
Hemoglobin: 13.5 g/dL (ref 12.0–15.0)
MCH: 32.3 pg (ref 26.0–34.0)
MCHC: 35.1 g/dL (ref 30.0–36.0)
MCV: 92.1 fL (ref 78.0–100.0)
PLATELETS: 178 10*3/uL (ref 150–400)
RBC: 4.18 MIL/uL (ref 3.87–5.11)
RDW: 12.6 % (ref 11.5–15.5)
WBC: 5.5 10*3/uL (ref 4.0–10.5)

## 2015-10-14 NOTE — Progress Notes (Signed)
Patient ID: Sarah Rangel, female   DOB: 24-Feb-1956, 60 y.o.   MRN: [id number] GYNECOLOGY  VISIT   HPI: 60 y.o.   Married  Caucasian  female   G2P2002 with Patient's last menstrual period was 09/18/2008 (approximate).   here for surgical consult.  Future TAH/BSO/abdominosacrocolpopexy/Hallban's culdoplasty/TVT midurethral sling/cystoscopy/anterior and possible posterior colporrhaphy anticipated. Asking for discussion of the procedure and reviewing the removal or not of the cervix, what is being done, the use of the mesh material, wants some discussion with her husband also.   Prolapse has been progressive, and patient is not able to tolerate this any longer. Physical therapy and pessary were not satisfactory to patient.  Patient has been preparing for over one year for surgery and has done weight loss to prepare for it.   No leakage of urine.  Voiding well.  Does manipulate to get a better stream by pushing up to void.  No constipation or loss of control of stool.  Multichannel urodynamic testing with reduction of the prolapse on 08/27/15: Uroflow - Void 200 cc, PVR 160 cc. Intermittent flow.  CMG - S1 - 208 cc, S2 429 cc, S3 800 cc. Max capacity 848.4 cc. VLPP - no leakage. Stable CMG. UCP - 42 cm H2O. Pressure flow study - P det max 55 cm H2O. Voided 992 cc. No evidence of genuine stress incontinence with reduction of prolapse.   Sexually active.   Colonoscopy 09/02/15 - hemorrhoids.   Has known low sodium.    Has known white coat syndrome. Brought in a card with her personal blood pressure readings.   PCP - Dr. Edilia Bo  GYNECOLOGIC HISTORY: Patient's last menstrual period was 09/18/2008 (approximate). Contraception:Vasectomy Menopausal hormone therapy: None Last mammogram: 04-13-15 D[facility] Cat[facility]B/Neg/BiRads[facility].  Dx left mammogram and ultrasound normal. Last pap smear: 08-19-14 Neg:Neg HR HPV        OB History    Gravida Para Term Preterm AB TAB  SAB Ectopic Multiple Living   2 2 2       2          Patient Active Problem List   Diagnosis Date Noted  . Worsening headaches 12/05/2013  . Unspecified essential hypertension 11/06/2013  . Stress 11/06/2013    Past Medical History  Diagnosis Date  . History of cystocele   . Hypertension   . Anxiety     Past Surgical History  Procedure Laterality Date  . Wisdom tooth extraction      age 60    Current Outpatient Prescriptions  Medication Sig Dispense Refill  . acetaminophen (TYLENOL) 500 MG tablet Take 1,000 mg by mouth every 6 (six) hours as needed for mild pain.    . diphenhydrAMINE (BENADRYL) 25 mg capsule Take 25 mg by mouth every 6 (six) hours as needed for sleep.    . hydrALAZINE (APRESOLINE) 10 MG tablet Take 2 tablets (20 mg total) by mouth 3 (three) times daily. 540 tablet 3  . LORazepam (ATIVAN) 0.5 MG tablet Take 1/2 or 1 twice daily as needed for occasional anxiety 30 tablet 0  . NIFEdipine (NIFEDICAL XL) 30 MG 24 hr tablet Take 1 tablet (30 mg total) by mouth 2 (two) times daily. (Patient taking differently: Take 60 mg by mouth daily. ) 180 tablet 3  . olmesartan (BENICAR) 40 MG tablet Take 1 tablet (40 mg total) by mouth daily. 90 tablet 3   No current facility-administered medications for this visit.     ALLERGIES: Review of patient's allergies indicates no  known allergies.  Family History  Problem Relation Age of Onset  . Hypertension Father   . Colon cancer Neg Hx   . Pancreatic cancer Neg Hx   . Stomach cancer Neg Hx   . Brain cancer Mother   . Lymphoma Mother     Social History   Social History  . Marital Status: Married    Spouse Name: N/A  . Number of Children: N/A  . Years of Education: N/A   Occupational History  . Not on file.   Social History Main Topics  . Smoking status: Former Smoker    Types: Cigarettes  . Smokeless tobacco: Never Used  . Alcohol Use: 1.2 oz/week    2 Standard drinks or equivalent per week     Comment:  occasionally 1 time monthly  . Drug Use: No  . Sexual Activity:    Partners: Male    Birth Control/ Protection: Other-see comments, Post-menopausal     Comment: vasectomy   Other Topics Concern  . Not on file   Social History Narrative    ROS:  Pertinent items are noted in HPI.  PHYSICAL EXAMINATION:    BP 180/98 mmHg  Pulse 76  Ht 5\' 6"  (1.676 m)  Wt 173 lb (78.472 kg)  BMI 27.94 kg/m2  LMP 09/18/2008 (Approximate)    General appearance: alert, cooperative and appears stated age.  Anxious. Head: Normocephalic, without obvious abnormality, atraumatic Neck: no adenopathy, supple, symmetrical, trachea midline and thyroid normal to inspection and palpation Lungs: clear to auscultation bilaterally   Heart: regular rate and rhythm Abdomen: soft, non-tender; bowel sounds normal; no masses,  no organomegaly Extremities: extremities normal, atraumatic, no cyanosis or edema Skin: Skin color, texture, turgor normal. No rashes or lesions Lymph nodes: Cervical, supraclavicular, and axillary nodes normal. No abnormal inguinal nodes palpated Neurologic: Grossly normal  Pelvic: External genitalia:  no lesions              Urethra:  normal appearing urethra with no masses, tenderness or lesions              Bartholins and Skenes: normal                 Vagina: normal appearing vagina with normal color and discharge, no lesions.  Complete uterovaginal prolapse.               Cervix: no lesions              Pap taken: No. Bimanual Exam:  Uterus:  normal size, contour, position, consistency, mobility, non-tender              Adnexa: normal adnexa and no mass, fullness, tenderness              Rectovaginal: Yes.  .  Confirms.              Anus:  normal sphincter tone, no lesions  Chaperone was present for exam.  ASSESSMENT  Complete uterovaginal prolapse.  Elevated blood pressure.  Has white coat syndrome.  PLAN  Proceed with total abdominal hysterectomy with bilateral  salpingo-oophorectomy, abdomino-sacrocolpopexy, Halban's culdoplasty, TVT Exact midurethral sling and cystoscopy and possible anterior and posterior colporrhaphy.   Risks, benefits, and alternatives have been discussed with the patient who wishes to proceed. Surgical procedure including the use of permanent mesh for the sacrocolpopexy and midurethral sling, hospital care, and postop expectations discussed with the patient.  Will touch base with anesthesia about patient's blood pressure readings.  An After Visit Summary was printed and given to the patient.  __40____ minutes face to face time of which over 50% was spent in counseling.

## 2015-10-14 NOTE — Telephone Encounter (Signed)
Sarah Rangel with outpatient scheduling calling for prior authorization for patient's surgery. Patient is scheduled for surgery 11/02/15. Best # to reach: [phone]

## 2015-10-14 NOTE — Pre-Procedure Instructions (Signed)
Dr. Ermalene Postin made aware of patient's elevated blood pressures at pre-op appointment today.  Patient states her blood pressures are always elevated when she comes to the doctor's office or hospital.  She brought in with her today a list of her blood pressure reading at home and a copy is placed on the chart for review.  Patient was visibly nervous with deep sighs during her appointment today and she verbalized she was really anxious about the upcoming procedure.  I reviewed all this with Dr. Ermalene Postin no new orders received at this time, will procedure with surgery as scheduled.

## 2015-10-14 NOTE — Telephone Encounter (Signed)
Returned call to outpatient scheduling, spoke with Pamala Hurry and provided the prior authorization number [id number]) for 11/02/15 surgery date. Pamala Hurry advised she will "key in" this information

## 2015-10-14 NOTE — Telephone Encounter (Signed)
Patient seen in office today she said she will need a letter stating that she will be out of work due to surgery for dates 2/13-3/27/17. Patient said she will come back to the office to pick it up

## 2015-10-15 ENCOUNTER — Encounter: Payer: Self-pay | Admitting: Emergency Medicine

## 2015-10-15 ENCOUNTER — Encounter: Payer: Self-pay | Admitting: Obstetrics and Gynecology

## 2015-10-15 NOTE — Telephone Encounter (Signed)
[place] for letter for time out from surgery for 6 weeks.  Please coordinate this with Ivar Drape also, so there is consistency with dates for all disability paperwork.   I did speak with Dr. Glennon Mac from anesthesia at [facility] just now.  Recommendations for her blood pressure care are for her to continue on her current regimen of antihypertensives and not skip any doses.   She is to follow her usual antiHTN medication regimen the morning of surgery.  This means she can take any am dosage the day of surgery with a sip of water.   Thanks.  Cc- Ivar Drape

## 2015-10-15 NOTE — Telephone Encounter (Signed)
Patient notified of message from Dr. Quincy Simmonds and she is very thankful for her help.  She will not miss any doses and will take morning medications on day of surgery as instructed.  Letter at front desk for patient pick up.  Routing to provider for final review. Patient agreeable to disposition. Will close encounter.

## 2015-10-15 NOTE — Telephone Encounter (Signed)
Dr. Quincy Rangel,  Okay to write letter for time off of work?   Also, patient sent mychart message with the following information this morning:   Non-Urgent Medical Question     From  [place], MD    Sent  10/15/2015 7:40 AM     Dr Sarah Rangel-  Just wanted you to know my BP last night was 146/70, this morning it was 148/72 (I had my husband check also).  Dr Lorelei Pont has been working with me to ensure my BP is under control, especially since I have been anticipating surgery.     Thanks for all the time you spent with [place] yesterday.  Thanks for your understanding and help.  Sarah Rangel

## 2015-10-15 NOTE — Telephone Encounter (Signed)
Message to Dr. Quincy Simmonds in open telephone encounter.

## 2015-10-18 ENCOUNTER — Other Ambulatory Visit: Payer: Self-pay | Admitting: Family Medicine

## 2015-10-19 MED FILL — LORazepam 0.5 MG TABS: 0.5 | 15 days supply | Qty: 30 | Fill #0

## 2015-10-27 ENCOUNTER — Telehealth: Payer: Self-pay | Admitting: Obstetrics and Gynecology

## 2015-10-27 NOTE — Telephone Encounter (Signed)
Patient is asking to talk with Sarah Rangel regarding the short term disability forms she dropped off. Patient said she originally told Sarah Rangel the wrong date she needed the forms completed. [place] is asking if the forms could be completed by the end of next week.

## 2015-10-28 NOTE — Telephone Encounter (Signed)
Patient advised FMLA forms were faxed this morning and a copy will be left in the patient pick up drawer to get at her next visit. Patient appreciative.

## 2015-11-01 MED ORDER — DEXTROSE 5 % IV SOLN
2.0000 g | INTRAVENOUS | Status: AC
  Administered 2015-11-02: 2 g via INTRAVENOUS
  Filled 2015-11-01: qty 2

## 2015-11-01 NOTE — H&P (Signed)
Progress Notes      Nunzio Cobbs, MD at 10/14/2015 2:25 PM     Status: Signed       Expand All Collapse All   Patient ID: Sarah Rangel, female DOB: 1956-05-22, 60 y.o. MRN: [id number] GYNECOLOGY VISIT  HPI: 60 y.o. Married Caucasian female  G2P2002 with Patient's last menstrual period was 09/18/2008 (approximate).  here for surgical consult.  Future TAH/BSO/abdominosacrocolpopexy/Hallban's culdoplasty/TVT midurethral sling/cystoscopy/anterior and possible posterior colporrhaphy anticipated. Asking for discussion of the procedure and reviewing the removal or not of the cervix, what is being done, the use of the mesh material, wants some discussion with her husband also.  Prolapse has been progressive, and patient is not able to tolerate this any longer. Physical therapy and pessary were not satisfactory to patient.  Patient has been preparing for over one year for surgery and has done weight loss to prepare for it.   No leakage of urine.  Voiding well.  Does manipulate to get a better stream by pushing up to void.  No constipation or loss of control of stool.  Multichannel urodynamic testing with reduction of the prolapse on 08/27/15: Uroflow - Void 200 cc, PVR 160 cc. Intermittent flow.  CMG - S1 - 208 cc, S2 429 cc, S3 800 cc. Max capacity 848.4 cc. VLPP - no leakage. Stable CMG. UCP - 42 cm H2O. Pressure flow study - P det max 55 cm H2O. Voided 992 cc. No evidence of genuine stress incontinence with reduction of prolapse.   Sexually active.   Colonoscopy 09/02/15 - hemorrhoids.   Has known low sodium.   Has known white coat syndrome. Brought in a card with her personal blood pressure readings.   PCP - Dr. Edilia Bo  GYNECOLOGIC HISTORY: Patient's last menstrual period was 09/18/2008 (approximate). Contraception:Vasectomy Menopausal hormone therapy: None Last mammogram: 04-13-15 D[facility] Cat[facility]B/Neg/BiR[facility].  Dx left mammogram and ultrasound normal. Last pap smear: 08-19-14 Neg:Neg HR HPV   OB History    Gravida Para Term Preterm AB TAB SAB Ectopic Multiple Living   2 2 2       2        Patient Active Problem List   Diagnosis Date Noted  . Worsening headaches 12/05/2013  . Unspecified essential hypertension 11/06/2013  . Stress 11/06/2013    Past Medical History  Diagnosis Date  . History of cystocele   . Hypertension   . Anxiety     Past Surgical History  Procedure Laterality Date  . Wisdom tooth extraction      age 27    Current Outpatient Prescriptions  Medication Sig Dispense Refill  . acetaminophen (TYLENOL) 500 MG tablet Take 1,000 mg by mouth every 6 (six) hours as needed for mild pain.    . diphenhydrAMINE (BENADRYL) 25 mg capsule Take 25 mg by mouth every 6 (six) hours as needed for sleep.    . hydrALAZINE (APRESOLINE) 10 MG tablet Take 2 tablets (20 mg total) by mouth 3 (three) times daily. 540 tablet 3  . LORazepam (ATIVAN) 0.5 MG tablet Take 1/2 or 1 twice daily as needed for occasional anxiety 30 tablet 0  . NIFEdipine (NIFEDICAL XL) 30 MG 24 hr tablet Take 1 tablet (30 mg total) by mouth 2 (two) times daily. (Patient taking differently: Take 60 mg by mouth daily. ) 180 tablet 3  . olmesartan (BENICAR) 40 MG tablet Take 1 tablet (40 mg total) by mouth daily. 90 tablet 3   No current facility-administered medications for  this visit.     ALLERGIES: Review of patient's allergies indicates no known allergies.  Family History  Problem Relation Age of Onset  . Hypertension Father   . Colon cancer Neg Hx   . Pancreatic cancer Neg Hx   . Stomach cancer Neg Hx   . Brain cancer Mother   . Lymphoma Mother     Social History   Social History  . Marital Status: Married    Spouse Name: N/A  . Number of  Children: N/A  . Years of Education: N/A   Occupational History  . Not on file.   Social History Main Topics  . Smoking status: Former Smoker    Types: Cigarettes  . Smokeless tobacco: Never Used  . Alcohol Use: 1.2 oz/week    2 Standard drinks or equivalent per week     Comment: occasionally 1 time monthly  . Drug Use: No  . Sexual Activity:    Partners: Male    Birth Control/ Protection: Other-see comments, Post-menopausal     Comment: vasectomy   Other Topics Concern  . Not on file   Social History Narrative    ROS: Pertinent items are noted in HPI.  PHYSICAL EXAMINATION:   BP 180/98 mmHg  Pulse 76  Ht 5\' 6"  (1.676 m)  Wt 173 lb (78.472 kg)  BMI 27.94 kg/m2  LMP 09/18/2008 (Approximate)  General appearance: alert, cooperative and appears stated age. Anxious. Head: Normocephalic, without obvious abnormality, atraumatic Neck: no adenopathy, supple, symmetrical, trachea midline and thyroid normal to inspection and palpation Lungs: clear to auscultation bilaterally  Heart: regular rate and rhythm Abdomen: soft, non-tender; bowel sounds normal; no masses, no organomegaly Extremities: extremities normal, atraumatic, no cyanosis or edema Skin: Skin color, texture, turgor normal. No rashes or lesions Lymph nodes: Cervical, supraclavicular, and axillary nodes normal. No abnormal inguinal nodes palpated Neurologic: Grossly normal  Pelvic: External genitalia: no lesions  Urethra: normal appearing urethra with no masses, tenderness or lesions  Bartholins and Skenes: normal   Vagina: normal appearing vagina with normal color and discharge, no lesions. Complete uterovaginal prolapse.   Cervix: no lesions  Pap taken: No. Bimanual Exam: Uterus: normal size, contour, position, consistency, mobility, non-tender  Adnexa: normal  adnexa and no mass, fullness, tenderness  Rectovaginal: Yes. . Confirms.  Anus: normal sphincter tone, no lesions  Chaperone was present for exam.  ASSESSMENT  Complete uterovaginal prolapse.  Elevated blood pressure. Has white coat syndrome.  PLAN  Proceed with total abdominal hysterectomy with bilateral salpingo-oophorectomy, abdomino-sacrocolpopexy, Halban's culdoplasty, TVT Exact midurethral sling and cystoscopy and possible anterior and posterior colporrhaphy. Risks, benefits, and alternatives have been discussed with the patient who wishes to proceed. Surgical procedure including the use of permanent mesh for the sacrocolpopexy and midurethral sling, hospital care, and postop expectations discussed with the patient.  Will touch base with anesthesia about patient's blood pressure readings.   An After Visit Summary was printed and given to the patient.  __40____ minutes face to face time of which over 50% was spent in counseling.

## 2015-11-02 ENCOUNTER — Inpatient Hospital Stay (HOSPITAL_COMMUNITY): Payer: 59 | Admitting: Anesthesiology

## 2015-11-02 ENCOUNTER — Encounter (HOSPITAL_COMMUNITY)
Admission: RE | Disposition: A | Discharge status: Home / Self Care | Payer: Self-pay | Source: Ambulatory Visit | Attending: Obstetrics and Gynecology

## 2015-11-02 ENCOUNTER — Inpatient Hospital Stay (HOSPITAL_COMMUNITY)
Admission: RE | Admit: 2015-11-02 | Discharge: 2015-11-05 | DRG: 742 | Disposition: A | Discharge status: Home / Self Care | Payer: 59 | Source: Ambulatory Visit | Attending: Obstetrics and Gynecology | Admitting: Obstetrics and Gynecology

## 2015-11-02 ENCOUNTER — Encounter (HOSPITAL_COMMUNITY): Payer: Self-pay | Admitting: Emergency Medicine

## 2015-11-02 DIAGNOSIS — Z79899 Other long term (current) drug therapy: Secondary | ICD-10-CM | POA: Diagnosis not present

## 2015-11-02 DIAGNOSIS — Z90710 Acquired absence of both cervix and uterus: Secondary | ICD-10-CM

## 2015-11-02 DIAGNOSIS — Z87891 Personal history of nicotine dependence: Secondary | ICD-10-CM | POA: Diagnosis not present

## 2015-11-02 DIAGNOSIS — N812 Incomplete uterovaginal prolapse: Secondary | ICD-10-CM | POA: Diagnosis not present

## 2015-11-02 DIAGNOSIS — N813 Complete uterovaginal prolapse: Secondary | ICD-10-CM | POA: Diagnosis not present

## 2015-11-02 DIAGNOSIS — E871 Hypo-osmolality and hyponatremia: Secondary | ICD-10-CM | POA: Diagnosis present

## 2015-11-02 DIAGNOSIS — I10 Essential (primary) hypertension: Secondary | ICD-10-CM | POA: Diagnosis present

## 2015-11-02 DIAGNOSIS — N838 Other noninflammatory disorders of ovary, fallopian tube and broad ligament: Secondary | ICD-10-CM | POA: Diagnosis not present

## 2015-11-02 DIAGNOSIS — Z9079 Acquired absence of other genital organ(s): Secondary | ICD-10-CM

## 2015-11-02 DIAGNOSIS — N393 Stress incontinence (female) (male): Secondary | ICD-10-CM | POA: Diagnosis not present

## 2015-11-02 DIAGNOSIS — Z90722 Acquired absence of ovaries, bilateral: Secondary | ICD-10-CM

## 2015-11-02 HISTORY — PX: BLADDER SUSPENSION: SHX72

## 2015-11-02 HISTORY — PX: ANTERIOR AND POSTERIOR REPAIR: SHX5121

## 2015-11-02 HISTORY — PX: SALPINGOOPHORECTOMY: SHX82

## 2015-11-02 HISTORY — PX: ABDOMINAL HYSTERECTOMY: SHX81

## 2015-11-02 HISTORY — DX: Personal history of other infectious and parasitic diseases: Z86.19

## 2015-11-02 HISTORY — PX: CYSTOSCOPY: SHX5120

## 2015-11-02 HISTORY — PX: ABDOMINAL SACROCOLPOPEXY: SHX1114

## 2015-11-02 SURGERY — HYSTERECTOMY, ABDOMINAL
Anesthesia: General

## 2015-11-02 MED ORDER — HYDROMORPHONE HCL 1 MG/ML IJ SOLN
INTRAMUSCULAR | Status: AC
  Filled 2015-11-02: qty 1

## 2015-11-02 MED ORDER — PROPOFOL 10 MG/ML IV BOLUS
INTRAVENOUS | Status: AC
  Filled 2015-11-02: qty 20

## 2015-11-02 MED ORDER — LIDOCAINE-EPINEPHRINE 1 %-1:100000 IJ SOLN
INTRAMUSCULAR | Status: DC | PRN
  Administered 2015-11-02: 9 mL

## 2015-11-02 MED ORDER — IBUPROFEN 600 MG PO TABS
600.0000 mg | ORAL_TABLET | Freq: Four times a day (QID) | ORAL | Status: DC | PRN
  Administered 2015-11-03 – 2015-11-04 (×5): 600 mg via ORAL
  Filled 2015-11-02 (×5): qty 1

## 2015-11-02 MED ORDER — NALOXONE HCL 0.4 MG/ML IJ SOLN: 0.4000 mg | INTRAMUSCULAR | Status: DC | PRN

## 2015-11-02 MED ORDER — PROPOFOL 10 MG/ML IV BOLUS
INTRAVENOUS | Status: DC | PRN
  Administered 2015-11-02: 180 mg via INTRAVENOUS

## 2015-11-02 MED ORDER — GLYCOPYRROLATE 0.2 MG/ML IJ SOLN
INTRAMUSCULAR | Status: AC
  Filled 2015-11-02: qty 1

## 2015-11-02 MED ORDER — MIDAZOLAM HCL 2 MG/2ML IJ SOLN
INTRAMUSCULAR | Status: DC | PRN
  Administered 2015-11-02: 2 mg via INTRAVENOUS

## 2015-11-02 MED ORDER — HYDROMORPHONE HCL 1 MG/ML IJ SOLN
0.2500 mg | INTRAMUSCULAR | Status: DC | PRN
  Administered 2015-11-02 (×3): 0.5 mg via INTRAVENOUS

## 2015-11-02 MED ORDER — ROCURONIUM BROMIDE 100 MG/10ML IV SOLN
INTRAVENOUS | Status: AC
  Filled 2015-11-02: qty 1

## 2015-11-02 MED ORDER — GLYCOPYRROLATE 0.2 MG/ML IJ SOLN
INTRAMUSCULAR | Status: DC | PRN
  Administered 2015-11-02: 0.2 mg via INTRAVENOUS
  Administered 2015-11-02: .5 mg via INTRAVENOUS

## 2015-11-02 MED ORDER — HYDRALAZINE HCL 10 MG PO TABS
20.0000 mg | ORAL_TABLET | Freq: Three times a day (TID) | ORAL | Status: DC
  Administered 2015-11-02 – 2015-11-05 (×9): 20 mg via ORAL
  Filled 2015-11-02 (×9): qty 2

## 2015-11-02 MED ORDER — SCOPOLAMINE 1 MG/3DAYS TD PT72
1.0000 | MEDICATED_PATCH | Freq: Once | TRANSDERMAL | Status: DC
  Administered 2015-11-02: 1.5 mg via TRANSDERMAL

## 2015-11-02 MED ORDER — KETOROLAC TROMETHAMINE 15 MG/ML IJ SOLN
15.0000 mg | Freq: Four times a day (QID) | INTRAMUSCULAR | Status: DC
  Administered 2015-11-02 – 2015-11-03 (×3): 15 mg via INTRAVENOUS
  Filled 2015-11-02 (×5): qty 1

## 2015-11-02 MED ORDER — ROCURONIUM BROMIDE 100 MG/10ML IV SOLN
INTRAVENOUS | Status: DC | PRN
  Administered 2015-11-02: 50 mg via INTRAVENOUS
  Administered 2015-11-02 (×2): 30 mg via INTRAVENOUS
  Administered 2015-11-02 (×2): 10 mg via INTRAVENOUS

## 2015-11-02 MED ORDER — LACTATED RINGERS IV SOLN
INTRAVENOUS | Status: DC
  Administered 2015-11-03: 01:00:00 via INTRAVENOUS

## 2015-11-02 MED ORDER — KETOROLAC TROMETHAMINE 30 MG/ML IJ SOLN: 30.0000 mg | Freq: Once | INTRAMUSCULAR | Status: DC

## 2015-11-02 MED ORDER — MIDAZOLAM HCL 2 MG/2ML IJ SOLN
INTRAMUSCULAR | Status: AC
  Filled 2015-11-02: qty 2

## 2015-11-02 MED ORDER — ONDANSETRON HCL 4 MG/2ML IJ SOLN: 4.0000 mg | Freq: Four times a day (QID) | INTRAMUSCULAR | Status: DC | PRN

## 2015-11-02 MED ORDER — BUPIVACAINE-EPINEPHRINE (PF) 0.25% -1:200000 IJ SOLN
INTRAMUSCULAR | Status: AC
  Filled 2015-11-02: qty 30

## 2015-11-02 MED ORDER — ONDANSETRON HCL 4 MG PO TABS: 4.0000 mg | ORAL_TABLET | Freq: Four times a day (QID) | ORAL | Status: DC | PRN

## 2015-11-02 MED ORDER — ESTRADIOL 0.1 MG/GM VA CREA
TOPICAL_CREAM | VAGINAL | Status: AC
  Filled 2015-11-02: qty 42.5

## 2015-11-02 MED ORDER — LORAZEPAM 1 MG PO TABS
0.5000 mg | ORAL_TABLET | Freq: Two times a day (BID) | ORAL | Status: DC
  Administered 2015-11-03 – 2015-11-04 (×2): 0.5 mg via ORAL
  Filled 2015-11-02 (×5): qty 1

## 2015-11-02 MED ORDER — DEXAMETHASONE SODIUM PHOSPHATE 4 MG/ML IJ SOLN
INTRAMUSCULAR | Status: AC
  Filled 2015-11-02: qty 1

## 2015-11-02 MED ORDER — LIDOCAINE HCL (CARDIAC) 20 MG/ML IV SOLN
INTRAVENOUS | Status: AC
  Filled 2015-11-02: qty 5

## 2015-11-02 MED ORDER — EPHEDRINE SULFATE 50 MG/ML IJ SOLN
INTRAMUSCULAR | Status: DC | PRN
  Administered 2015-11-02: 15 mg via INTRAVENOUS
  Administered 2015-11-02 (×2): 5 mg via INTRAVENOUS

## 2015-11-02 MED ORDER — STERILE WATER FOR IRRIGATION IR SOLN
Status: DC | PRN
  Administered 2015-11-02: 1000 mL

## 2015-11-02 MED ORDER — OXYCODONE-ACETAMINOPHEN 5-325 MG PO TABS
1.0000 | ORAL_TABLET | ORAL | Status: DC | PRN
  Administered 2015-11-03 – 2015-11-05 (×11): 2 via ORAL
  Filled 2015-11-02 (×11): qty 2

## 2015-11-02 MED ORDER — ESTRADIOL 0.1 MG/GM VA CREA
TOPICAL_CREAM | VAGINAL | Status: DC | PRN
  Administered 2015-11-02: 1 via VAGINAL

## 2015-11-02 MED ORDER — LACTATED RINGERS IV SOLN
INTRAVENOUS | Status: DC
  Administered 2015-11-02: 125 mL/h via INTRAVENOUS
  Administered 2015-11-02 (×2): via INTRAVENOUS

## 2015-11-02 MED ORDER — GLYCOPYRROLATE 0.2 MG/ML IJ SOLN
INTRAMUSCULAR | Status: AC
  Filled 2015-11-02: qty 3

## 2015-11-02 MED ORDER — MENTHOL 3 MG MT LOZG: 1.0000 | LOZENGE | OROMUCOSAL | Status: DC | PRN

## 2015-11-02 MED ORDER — PROMETHAZINE HCL 25 MG/ML IJ SOLN: 6.2500 mg | INTRAMUSCULAR | Status: DC | PRN

## 2015-11-02 MED ORDER — NIFEDIPINE ER 60 MG PO TB24
60.0000 mg | ORAL_TABLET | Freq: Every day | ORAL | Status: DC
  Administered 2015-11-03 – 2015-11-05 (×3): 60 mg via ORAL
  Filled 2015-11-02 (×3): qty 1

## 2015-11-02 MED ORDER — EPHEDRINE 5 MG/ML INJ
INTRAVENOUS | Status: AC
  Filled 2015-11-02: qty 10

## 2015-11-02 MED ORDER — LIDOCAINE HCL (CARDIAC) 20 MG/ML IV SOLN
INTRAVENOUS | Status: DC | PRN
  Administered 2015-11-02: 30 mg via INTRAVENOUS
  Administered 2015-11-02: 70 mg via INTRAVENOUS

## 2015-11-02 MED ORDER — FENTANYL CITRATE (PF) 250 MCG/5ML IJ SOLN
INTRAMUSCULAR | Status: AC
  Filled 2015-11-02: qty 5

## 2015-11-02 MED ORDER — OLMESARTAN MEDOXOMIL 40 MG PO TABS
40.0000 mg | ORAL_TABLET | Freq: Every day | ORAL | Status: DC
  Administered 2015-11-03 – 2015-11-05 (×3): 40 mg via ORAL

## 2015-11-02 MED ORDER — SODIUM CHLORIDE 0.9% FLUSH: 9.0000 mL | INTRAVENOUS | Status: DC | PRN

## 2015-11-02 MED ORDER — NEOSTIGMINE METHYLSULFATE 10 MG/10ML IV SOLN
INTRAVENOUS | Status: AC
  Filled 2015-11-02: qty 1

## 2015-11-02 MED ORDER — NEOSTIGMINE METHYLSULFATE 10 MG/10ML IV SOLN
INTRAVENOUS | Status: DC | PRN
  Administered 2015-11-02: 3 mg via INTRAVENOUS

## 2015-11-02 MED ORDER — ONDANSETRON HCL 4 MG/2ML IJ SOLN
INTRAMUSCULAR | Status: DC | PRN
Start: 2015-11-02 — End: 2015-11-02
  Administered 2015-11-02: 4 mg via INTRAVENOUS

## 2015-11-02 MED ORDER — FENTANYL CITRATE (PF) 100 MCG/2ML IJ SOLN
INTRAMUSCULAR | Status: DC | PRN
  Administered 2015-11-02 (×10): 50 ug via INTRAVENOUS

## 2015-11-02 MED ORDER — ONDANSETRON HCL 4 MG/2ML IJ SOLN
INTRAMUSCULAR | Status: AC
  Filled 2015-11-02: qty 2

## 2015-11-02 MED ORDER — DEXAMETHASONE SODIUM PHOSPHATE 10 MG/ML IJ SOLN
INTRAMUSCULAR | Status: DC | PRN
  Administered 2015-11-02: 4 mg via INTRAVENOUS

## 2015-11-02 MED ORDER — KETOROLAC TROMETHAMINE 30 MG/ML IJ SOLN
INTRAMUSCULAR | Status: AC
  Filled 2015-11-02: qty 1

## 2015-11-02 MED ORDER — MEPERIDINE HCL 25 MG/ML IJ SOLN: 6.2500 mg | INTRAMUSCULAR | Status: DC | PRN

## 2015-11-02 MED ORDER — DIPHENHYDRAMINE HCL 50 MG/ML IJ SOLN: 12.5000 mg | Freq: Four times a day (QID) | INTRAMUSCULAR | Status: DC | PRN

## 2015-11-02 MED ORDER — MORPHINE SULFATE 2 MG/ML IV SOLN
INTRAVENOUS | Status: DC
  Administered 2015-11-02: 6 mg via INTRAVENOUS
  Administered 2015-11-02: 15:00:00 via INTRAVENOUS
  Administered 2015-11-02: 4 mg via INTRAVENOUS
  Administered 2015-11-03: 1 mg via INTRAVENOUS
  Administered 2015-11-03: 4 mg via INTRAVENOUS
  Administered 2015-11-03: 1 mg via INTRAVENOUS
  Filled 2015-11-02: qty 25

## 2015-11-02 MED ORDER — LIDOCAINE-EPINEPHRINE 1 %-1:100000 IJ SOLN
INTRAMUSCULAR | Status: AC
  Filled 2015-11-02: qty 2

## 2015-11-02 MED ORDER — KETOROLAC TROMETHAMINE 30 MG/ML IJ SOLN
INTRAMUSCULAR | Status: DC | PRN
  Administered 2015-11-02: 30 mg via INTRAVENOUS

## 2015-11-02 MED ORDER — DIPHENHYDRAMINE HCL 12.5 MG/5ML PO ELIX: 12.5000 mg | ORAL_SOLUTION | Freq: Four times a day (QID) | ORAL | Status: DC | PRN

## 2015-11-02 MED ORDER — BUPIVACAINE HCL (PF) 0.25 % IJ SOLN
INTRAMUSCULAR | Status: AC
  Filled 2015-11-02: qty 30

## 2015-11-02 MED ORDER — SCOPOLAMINE 1 MG/3DAYS TD PT72
MEDICATED_PATCH | TRANSDERMAL | Status: AC
  Administered 2015-11-02: 1.5 mg via TRANSDERMAL
  Filled 2015-11-02: qty 1

## 2015-11-02 MED ORDER — LACTATED RINGERS IV SOLN: INTRAVENOUS | Status: DC

## 2015-11-02 SURGICAL SUPPLY — 82 items
APL SKNCLS STERI-STRIP NONHPOA (GAUZE/BANDAGES/DRESSINGS) ×2
BENZOIN TINCTURE PRP APPL 2/3 (GAUZE/BANDAGES/DRESSINGS) ×3 IMPLANT
BLADE SURG 11 STRL SS (BLADE) ×3 IMPLANT
CANISTER SUCT 3000ML (MISCELLANEOUS) ×3 IMPLANT
CATH FOLEY 2WAY SLVR  5CC 18FR (CATHETERS) ×1
CATH FOLEY 2WAY SLVR 5CC 18FR (CATHETERS) ×2 IMPLANT
CATH FOLEY 3WAY  5CC 16FR (CATHETERS)
CATH FOLEY 3WAY 30CC 18FR (CATHETERS) IMPLANT
CATH FOLEY 3WAY 5CC 16FR (CATHETERS) IMPLANT
CELLS DAT CNTRL 66122 CELL SVR (MISCELLANEOUS) IMPLANT
CLOTH BEACON ORANGE TIMEOUT ST (SAFETY) ×3 IMPLANT
CONT PATH 16OZ SNAP LID 3702 (MISCELLANEOUS) IMPLANT
DECANTER SPIKE VIAL GLASS SM (MISCELLANEOUS) IMPLANT
DEVICE CAPIO SLIM SINGLE (INSTRUMENTS) IMPLANT
DISSECTOR SPONGE CHERRY (GAUZE/BANDAGES/DRESSINGS) ×3 IMPLANT
DRAPE UNDERBUTTOCKS STRL (DRAPE) ×3 IMPLANT
DRAPE WARM FLUID 44X44 (DRAPE) ×1 IMPLANT
DRSG OPSITE POSTOP 4X10 (GAUZE/BANDAGES/DRESSINGS) ×3 IMPLANT
DURAPREP 26ML APPLICATOR (WOUND CARE) ×3 IMPLANT
ELECT BLADE 6 FLAT ULTRCLN (ELECTRODE) ×2 IMPLANT
FILTER STRAW FLUID ASPIR (MISCELLANEOUS) IMPLANT
GAUZE PACKING 2X5 YD STRL (GAUZE/BANDAGES/DRESSINGS) ×3 IMPLANT
GAUZE SPONGE 4X4 16PLY XRAY LF (GAUZE/BANDAGES/DRESSINGS) ×3 IMPLANT
GLOVE BIO SURGEON STRL SZ 6.5 (GLOVE) ×6 IMPLANT
GLOVE BIOGEL PI IND STRL 6.5 (GLOVE) ×2 IMPLANT
GLOVE BIOGEL PI IND STRL 7.0 (GLOVE) ×6 IMPLANT
GLOVE BIOGEL PI INDICATOR 6.5 (GLOVE) ×1
GLOVE BIOGEL PI INDICATOR 7.0 (GLOVE) ×3
GOWN STRL REUS W/TWL LRG LVL3 (GOWN DISPOSABLE) ×12 IMPLANT
GRAFT Y-MESH ALYTE (Mesh General) ×2 IMPLANT
LIQUID BAND (GAUZE/BANDAGES/DRESSINGS) ×3 IMPLANT
NDL MAYO 6 CRC TAPER PT (NEEDLE) IMPLANT
NEEDLE HYPO 22GX1.5 SAFETY (NEEDLE) ×3 IMPLANT
NEEDLE MAYO 6 CRC TAPER PT (NEEDLE) ×3 IMPLANT
NS IRRIG 1000ML POUR BTL (IV SOLUTION) ×4 IMPLANT
PACK ABDOMINAL GYN (CUSTOM PROCEDURE TRAY) ×3 IMPLANT
PACK VAGINAL MINOR WOMEN LF (CUSTOM PROCEDURE TRAY) ×3 IMPLANT
PACK VAGINAL WOMENS (CUSTOM PROCEDURE TRAY) ×3 IMPLANT
PAD MAGNETIC INST (MISCELLANEOUS) ×3 IMPLANT
PAD OB MATERNITY 4.3X12.25 (PERSONAL CARE ITEMS) ×3 IMPLANT
PLUG CATH AND CAP STER (CATHETERS) ×3 IMPLANT
RETRACTOR STAY HOOK 5MM (MISCELLANEOUS) ×3 IMPLANT
RETRACTOR WND ALEXIS 18 MED (MISCELLANEOUS) IMPLANT
RETRACTOR WND ALEXIS 25 LRG (MISCELLANEOUS) IMPLANT
RTRCTR WOUND ALEXIS 18CM MED (MISCELLANEOUS)
RTRCTR WOUND ALEXIS 25CM LRG (MISCELLANEOUS)
SET CYSTO W/LG BORE CLAMP LF (SET/KITS/TRAYS/PACK) ×3 IMPLANT
SHEET LAVH (DRAPES) ×3 IMPLANT
SLING TVT EXACT (Sling) ×2 IMPLANT
SPONGE GAUZE 4X4 12PLY STER LF (GAUZE/BANDAGES/DRESSINGS) ×2 IMPLANT
SPONGE LAP 18X18 X RAY DECT (DISPOSABLE) ×2 IMPLANT
SPONGE LAP 4X18 X RAY DECT (DISPOSABLE) ×3 IMPLANT
SPONGE SURGIFOAM ABS GEL 12-7 (HEMOSTASIS) IMPLANT
STAPLER VISISTAT 35W (STAPLE) ×2 IMPLANT
STRIP CLOSURE SKIN 1/2X4 (GAUZE/BANDAGES/DRESSINGS) ×2 IMPLANT
SURGIFLO TRUKIT (HEMOSTASIS) IMPLANT
SUT CAPIO ETHIBPND (SUTURE) IMPLANT
SUT ETHIBOND 0 (SUTURE) ×8 IMPLANT
SUT PDS AB 0 CT1 27 (SUTURE) IMPLANT
SUT PDS AB 2-0 CT1 27 (SUTURE) IMPLANT
SUT PLAIN 2 0 XLH (SUTURE) ×1 IMPLANT
SUT VIC AB 0 CT1 18XCR BRD8 (SUTURE) ×4 IMPLANT
SUT VIC AB 0 CT1 27 (SUTURE) ×9
SUT VIC AB 0 CT1 27XBRD ANBCTR (SUTURE) ×6 IMPLANT
SUT VIC AB 0 CT1 8-18 (SUTURE) ×9
SUT VIC AB 2-0 CT1 (SUTURE) ×3 IMPLANT
SUT VIC AB 2-0 CT1 27 (SUTURE) ×3
SUT VIC AB 2-0 CT1 TAPERPNT 27 (SUTURE) ×2 IMPLANT
SUT VIC AB 2-0 CT2 27 (SUTURE) IMPLANT
SUT VIC AB 2-0 SH 27 (SUTURE) ×21
SUT VIC AB 2-0 SH 27XBRD (SUTURE) ×8 IMPLANT
SUT VIC AB 2-0 UR6 27 (SUTURE) IMPLANT
SUT VIC AB 4-0 PS2 27 (SUTURE) ×1 IMPLANT
SUT VICRYL 0 TIES 12 18 (SUTURE) ×3 IMPLANT
SYR 30ML LL (SYRINGE) IMPLANT
SYR 50ML LL SCALE MARK (SYRINGE) ×3 IMPLANT
SYR CONTROL 10ML LL (SYRINGE) IMPLANT
TOWEL OR 17X24 6PK STRL BLUE (TOWEL DISPOSABLE) ×6 IMPLANT
TRAY FOLEY BAG SILVER LF 16FR (SET/KITS/TRAYS/PACK) ×3 IMPLANT
TRAY FOLEY CATH SILVER 14FR (SET/KITS/TRAYS/PACK) ×2 IMPLANT
TUBING NON-CON 1/4 X 20 CONN (TUBING) ×1 IMPLANT
WATER STERILE IRR 1000ML POUR (IV SOLUTION) ×1 IMPLANT

## 2015-11-02 NOTE — Anesthesia Preprocedure Evaluation (Addendum)
Anesthesia Evaluation  Patient identified by MRN, date of birth, ID band Patient awake    Reviewed: Allergy & Precautions, H&P , NPO status , Patient's Chart, lab work & pertinent test results  Airway Mallampati: I  TM Distance: >3 FB Neck ROM: full    Dental no notable dental hx. (+) Teeth Intact   Pulmonary former smoker,    Pulmonary exam normal        Cardiovascular hypertension, Pt. on medications Normal cardiovascular exam     Neuro/Psych    GI/Hepatic negative GI ROS, Neg liver ROS,   Endo/Other  negative endocrine ROS  Renal/GU negative Renal ROS     Musculoskeletal   Abdominal Normal abdominal exam  (+)   Peds  Hematology negative hematology ROS (+)   Anesthesia Other Findings   Reproductive/Obstetrics negative OB ROS                         Anesthesia Physical Anesthesia Plan  ASA: II  Anesthesia Plan: General   Post-op Pain Management:    Induction: Intravenous  Airway Management Planned: Oral ETT  Additional Equipment:   Intra-op Plan:   Post-operative Plan: Extubation in OR  Informed Consent: I have reviewed the patients History and Physical, chart, labs and discussed the procedure including the risks, benefits and alternatives for the proposed anesthesia with the patient or authorized representative who has indicated his/her understanding and acceptance.   Dental Advisory Given  Plan Discussed with: CRNA and Surgeon  Anesthesia Plan Comments:         Anesthesia Quick Evaluation

## 2015-11-02 NOTE — Transfer of Care (Signed)
Immediate Anesthesia Transfer of Care Note  Patient: Sarah Rangel  Procedure(s) Performed: Procedure(s) with comments: HYSTERECTOMY ABDOMINAL (N/A) - 4 hours SALPINGO OOPHORECTOMY (Bilateral) ABDOMINO SACROCOLPOPEXY with Alyte mesh and Halban's Culdoplasty (N/A) POSTERIOR REPAIR (RECTOCELE)  (N/A) TRANSVAGINAL TAPE (TVT) PROCEDURE exact mid-urethral sling (N/A) CYSTOSCOPY (N/A)  Patient Location: PACU  Anesthesia Type:General  Level of Consciousness: awake, alert , oriented and patient cooperative  Airway & Oxygen Therapy: Patient Spontanous Breathing and Patient connected to nasal cannula oxygen  Post-op Assessment: Report given to RN and Post -op Vital signs reviewed and stable  Post vital signs: Reviewed and stable  Last Vitals:  Filed Vitals:   11/02/15 0647  BP: 117/69  Pulse: 87  Temp: 36.6 C  Resp: 18    Complications: No apparent anesthesia complications

## 2015-11-02 NOTE — OR Nursing (Signed)
Family updated on patient condition and surgical progress 

## 2015-11-02 NOTE — Anesthesia Postprocedure Evaluation (Signed)
Anesthesia Post Note  Patient: Sarah Rangel  Procedure(s) Performed: Procedure(s) (LRB): HYSTERECTOMY ABDOMINAL (N/A) SALPINGO OOPHORECTOMY (Bilateral) ABDOMINO SACROCOLPOPEXY with Alyte mesh and Halban's Culdoplasty (N/A) POSTERIOR REPAIR (RECTOCELE)  (N/A) TRANSVAGINAL TAPE (TVT) PROCEDURE exact mid-urethral sling (N/A) CYSTOSCOPY (N/A)  Patient location during evaluation: PACU Anesthesia Type: General Level of consciousness: sedated Pain management: pain level controlled Vital Signs Assessment: post-procedure vital signs reviewed and stable Respiratory status: spontaneous breathing Cardiovascular status: stable Postop Assessment: no signs of nausea or vomiting Anesthetic complications: no    Last Vitals:  Filed Vitals:   11/02/15 2151 11/02/15 2154  BP: 145/66   Pulse: 76   Temp: 37.4 C   Resp: 18 18    Last Pain:  Filed Vitals:   11/02/15 2155  PainSc: [place]

## 2015-11-02 NOTE — Brief Op Note (Signed)
11/02/2015  12:25 PM  PATIENT:  Sarah Rangel  60 y.o. female  PRE-OPERATIVE DIAGNOSIS:  incomplete uterovaginal prolapse  POST-OPERATIVE DIAGNOSIS:  incomplete uterovaginal prolapse  PROCEDURE:  Procedure(s) with comments: HYSTERECTOMY ABDOMINAL (N/A) - 4 hours SALPINGO OOPHORECTOMY (Bilateral) ABDOMINO SACROCOLPOPEXY with Alyte mesh and Halban's Culdoplasty (N/A) POSTERIOR REPAIR (RECTOCELE)  (N/A) TRANSVAGINAL TAPE (TVT) PROCEDURE exact mid-urethral sling (N/A) CYSTOSCOPY (N/A)  SURGEON:  Surgeon(s) and Role:    * Brook E Yisroel Ramming, MD - Primary    * Salvadore Dom, MD - Assisting  PHYSICIAN ASSISTANT: NA  ASSISTANTS: Patty Sermons, MD   ANESTHESIA:   local and general  EBL:  Total I/O In: 1700 [I.V.:1700] Out: 525 [Urine:425; Blood:100]  BLOOD ADMINISTERED:none  DRAINS: Urinary Catheter (Foley)   LOCAL MEDICATIONS USED:  LIDOCAINE 1% with epinepherine 1;100,000  SPECIMEN:  Source of Specimen:  Uterus, cervix, bilateral tubes and bilateral ovaries.  DISPOSITION OF SPECIMEN:  PATHOLOGY  COUNTS:  YES  TOURNIQUET:  * No tourniquets in log *  DICTATION: .Other Dictation: Dictation Number    PLAN OF CARE: Admit to inpatient   PATIENT DISPOSITION:  PACU - hemodynamically stable.   Delay start of Pharmacological VTE agent (>24hrs) due to surgical blood loss or risk of bleeding: not applicable

## 2015-11-02 NOTE — Progress Notes (Signed)
Day of Surgery Procedure(s) (LRB): HYSTERECTOMY ABDOMINAL (N/A) SALPINGO OOPHORECTOMY (Bilateral) ABDOMINO SACROCOLPOPEXY with Alyte mesh and Halban's Culdoplasty (N/A) POSTERIOR REPAIR (RECTOCELE)  (N/A) TRANSVAGINAL TAPE (TVT) PROCEDURE exact mid-urethral sling (N/A) CYSTOSCOPY (N/A)  Subjective: Patient reports good pain control.  Hungry.   Objective: I have reviewed patient's vital signs and intake and output. t max 99.8, T now 98.2, BP 162/55, P 72, RR 14 I/O - 2200 cc/ 525 cc.  General: alert and cooperative Resp: clear to auscultation bilaterally Cardio: regular rate and rhythm, S1, S2 normal, no murmur, click, rub or gallop GI: soft, non-tender; bowel sounds normal; no masses,  no organomegaly and incision: Dressing clean, dry, and intact.  SP incisions - intact with Dermabond.  Extremities: PAS and Ted hose on.  Vaginal Bleeding: none  Assessment: s/p Procedure(s) with comments: HYSTERECTOMY ABDOMINAL (N/A) - 4 hours SALPINGO OOPHORECTOMY (Bilateral) ABDOMINO SACROCOLPOPEXY with Alyte mesh and Halban's Culdoplasty (N/A) POSTERIOR REPAIR (RECTOCELE)  (N/A) TRANSVAGINAL TAPE (TVT) PROCEDURE exact mid-urethral sling (N/A) CYSTOSCOPY (N/A): stable  Plan: Continue foley due to post op status.  Clear liquids Morphine PCA and Toradol IV for pain.   Surgical findings and procedure reviewed.  Questions answered.  LOS: 0 days    Arloa Koh 11/02/2015, 4:47 PM

## 2015-11-02 NOTE — Progress Notes (Signed)
Progress Note  No marked change in status since preop office visit.  Patient examined.   OK to proceed.

## 2015-11-02 NOTE — Anesthesia Postprocedure Evaluation (Signed)
Anesthesia Post Note  Patient: Sarah Rangel  Procedure(s) Performed: Procedure(s) (LRB): HYSTERECTOMY ABDOMINAL (N/A) SALPINGO OOPHORECTOMY (Bilateral) ABDOMINO SACROCOLPOPEXY with Alyte mesh and Halban's Culdoplasty (N/A) POSTERIOR REPAIR (RECTOCELE)  (N/A) TRANSVAGINAL TAPE (TVT) PROCEDURE exact mid-urethral sling (N/A) CYSTOSCOPY (N/A)  Patient location during evaluation: [facility] Anesthesia Type: General Level of consciousness: awake, awake and alert and oriented Pain management: pain level controlled Vital Signs Assessment: post-procedure vital signs reviewed and stable Respiratory status: nonlabored ventilation, spontaneous breathing and respiratory function stable Cardiovascular status: stable Postop Assessment: no signs of nausea or vomiting and adequate PO intake Anesthetic complications: no    Last Vitals:  Filed Vitals:   11/02/15 1534 11/02/15 1700  BP: 162/55 154/59  Pulse: 72 78  Temp: 36.8 C 37.4 C  Resp: 14 18    Last Pain:  Filed Vitals:   11/02/15 1701  PainSc: 4                  Mayetta Castleman

## 2015-11-02 NOTE — Op Note (Signed)
NAME:  Sarah Rangel, Sarah Rangel NO.:  [id number]  MEDICAL RECORD NO.:  [id number]  LOCATION:  9309                          FACILITY:  [facility]  PHYSICIAN:  Lenard Galloway, M.D.   DATE OF BIRTH:  1956/07/17  DATE OF PROCEDURE:  11/02/2015 DATE OF DISCHARGE:                              OPERATIVE REPORT   PREOPERATIVE DIAGNOSIS:  Complete uterovaginal prolapse.  POSTOPERATIVE DIAGNOSIS:  Complete uterovaginal prolapse.  PROCEDURE:  Total abdominal hysterectomy with bilateral salpingo- oophorectomy, abdominal sacral colpopexy, Halban's culdoplasty, TVT Exact midurethral sling and cystoscopy, posterior colporrhaphy.  SURGEON:  Lenard Galloway, MD  ASSISTANT:  Sumner Boast, MD  ANESTHESIA:  General endotracheal.  IV FLUIDS:  1700 mL Ringer's lactate.  EBL:  100 mL.  URINE OUTPUT:  425 mL.  COMPLICATIONS:  None.  INDICATIONS FOR THE PROCEDURE:  The patient is a 60 year old gravida 2, para 2, Caucasian female, who presents with progressive uterovaginal prolapse and a request for surgical care.  The patient has been noted to develop worsening uterovaginal prolapse and at her last examination was noted to have complete prolapse of the cervix through the introitus along with a third-degree cystocele and a small rectocele.  The patient has not been experiencing any urinary stress incontinence.  She did undergo multichannel urodynamic testing in the office with reduction of the prolapse and she did not demonstrate any stress incontinence at that time.  The plan is now made to proceed with a total abdominal hysterectomy with a bilateral salpingo-oophorectomy, abdominal sacral colpopexy, Halban's culdoplasty, TVT Exact mid urethral sling, and possible anterior and posterior colporrhaphy.  Risks, benefits, and alternatives have been reviewed with the patient who wishes to proceed.  FINDINGS:  Examination under anesthesia revealed a third-degree cystocele and first-degree  uterine prolapse and a first-degree rectocele.  At the time of laparotomy, the patient was noted to have a normal uterus, bilateral tubes and ovaries.  The patient was noted to have cervical elongation.  There was no evidence of any adhesive disease in the abdomen or pelvis.  The appendix was unremarkable.  The liver edge and gallbladder palpated to be normal.  The kidneys were unremarkable as was the periaortic region.  Cystoscopy at termination of the bladder portion of the procedure documented the bladder to be intact throughout 360 degrees including the bladder dome and trigone.  There was no evidence of a foreign body in the bladder or the urethra.  The ureters were patent bilaterally.  SPECIMENS:  The uterus, cervix, bilateral tubes and bilateral ovaries were sent to Pathology.  PROCEDURE IN DETAIL:  The patient was reidentified in the preop hold area.  She did receive cefotetan 2 g IV for antibiotic prophylaxis.  She received both TED hose and PAS stockings for DVT prophylaxis.  The patient was escorted to the operating room, where she was placed in the dorsal lithotomy position in [place].  General endotracheal anesthesia was induced.  The patient's lower abdomen, vagina, and perineum were sterilely prepped and she was sterilely draped.  The Foley catheter was placed inside the bladder and left to gravity drainage throughout and at the termination of the procedure.  The procedure began by creating  a Pfannenstiel incision with a scalpel. The incision was carried down to the subcutaneous tissues using monopolar cautery for hemostasis.  The fascia was incised in the midline with a scalpel and the incision was extended bilaterally using the Mayo scissors.  The rectus muscles were bluntly divided in the midline.  The parietal peritoneum was elevated with 2 hemostat clamps and entered sharply with the Metzenbaum scissors.  The peritoneal incision was extended cranially  and caudally.  An Alexis retractor was then placed inside the peritoneal cavity and the patient was placed in the Trendelenburg position.  The bowel was packed into the upper abdomen with moistened lap pads.  The procedure began by placing Kelly clamps across the adnexal structures bilaterally.  The left round ligament was isolated and was suture ligated with a transfixing suture of 0 Vicryl.  Monopolar cautery was used to bisect the round ligament and the anterior and posterior leaves were opened with monopolar cautery.  The bladder flap was taken down anteriorly using a Metzenbaum scissors.  The ureter was identified along the patient's left pelvic sidewall.  The window was created through the posterior leaf of the broad ligament on this side and the infundibulopelvic ligament was doubly clamped, sharply divided, and free tied with 0 Vicryl followed by suture ligature of the same.  The same procedure that was performed on the patient's left hand side was now repeated on the patient's right hand side for isolation of the round ligament, dissection in the broad ligament, and the isolation of the infundibulopelvic ligament.  Again, the ureter was identified prior to clamping the infundibulopelvic ligament.  The bladder flap was further taken down sharply with the Metzenbaum scissors.  Each of the uterine arteries were skeletonized and they were then clamped, sharply divided, and suture ligated with 0 Vicryl suture. The bladder was further dissected off the cervix anteriorly and the inferior aspects of the cardinal ligaments were clamped with straight Heaney clamps, sharply divided, and suture ligated with 0 Vicryl bilaterally.  Eventually, the uterosacral ligaments were reached.  A clamp was placed across the right vaginal apex and the vagina was entered in this location.  The pedicle was suture ligated with a transfixing suture of 0 Vicryl.  The same was performed on the  patient's left hand side.  The Mayo scissors was used to sharply excise the cervix and the specimen from the vaginal apex.  The specimen was sent to Pathology.  The vaginal cuff was closed at this time with figure-of-eight sutures of 0 Vicryl.  The pelvis was irrigated and suctioned and hemostasis was noted to be good.  The abdominal sacrocolpopexy was performed next.  An EEA Sizer was placed in the vagina at this time and the peritoneum along the anterior and posterior vaginal walls was opened and dissection performed down to the level of the underside of the vagina, both anteriorly and posteriorly.  Attention at this time was turned to the sacral promontory.  The peritoneum overlying the sacral promontory was identified along with the landmarks of the bifurcation of the aorta and iliac vessels and the right ureter.  The peritoneum was opened using a combination of a Metzenbaum scissors and monopolar cautery.  Careful dissection was performed down to the level of the sacral promontory.  The anterior longitudinal ligament was identified along with the middle sacral vessels.  Hemostasis was good.  The peritoneum was then opened inferiorly along the patient's right pelvic sidewall and all the way down to the level of dissection  along the posterior vaginal peritoneal incision.  At this time, the Alyte mesh was trimmed to properly fit the patient's anatomy and the mesh was secured to the anterior vagina using a series of several 0 Ethibond sutures.  The same was performed along the posterior vaginal wall.  The EEA Sizer was removed and a hand was placed inside the vagina and the elevation of the vaginal cuff was simulated.  The point of attachment of the Alyte mesh was then mapped to the sacral promontory and 2 sutures of 0 Ethibond were brought through the anterior longitudinal ligament to the right and to the left of the middle sacral vessels.  These sutures were then brought  through the mesh.  The sutures were tied.  Excess mesh was trimmed at this time.  The mesh was re- peritonealized by closing the retroperitoneal space using 2-0 Vicryl suture.  2-0 Vicryl suture was also used to close the peritoneum over the vaginal cuff extending from right round ligament to the left round ligament.  This allowed 100% coverage of the mesh material.  The pelvis was once again irrigated and suctioned.  There was bleeding of a superficial vessel of the peritoneum with the bladder flap had been taken down on the patient's left hand side and this did respond to figure-of-eight suture of 2-0 Vicryl.  Hemostasis was then good. The abdomen was closed at this time.  The moistened lap pads and the Alexis retractor were removed from the peritoneal cavity.  The parietal peritoneum was closed with a running suture of 2-0 Vicryl.  The rectus muscles were examined and there was a small oozing vessel on the patient's left rectus muscle which responded to monopolar cautery. Hemostasis was then good.  The fascia was closed with a running suture of 0 Vicryl.  The subcutaneous layer was irrigated and suctioned and made hemostatic with monopolar cautery.  Interrupted sutures of 2-0 plain were placed in the subcutaneous layer and the skin was closed with a subcuticular suture of 4-0 Vicryl.  Steri-Strips and benzoin were placed over the incision.  A sterile bandage was later placed.  Attention was turned to the vaginal portion of the procedure at this point.  The patient was examined and there was excellent vaginal cuff support.  There was no evidence of a cystocele at this time.  There was a first-degree rectocele noted.  Allis clamps were used to mark a 4 cm area underneath the beginning 1 cm below the urethral meatus and extending down toward the vaginal cuff. The mucosa was injected locally with 1% lidocaine with epinephrine 1:100,000.  The mucosa was incised vertically in the midline  with a scalpel and a combination of sharp and blunt dissection were used to dissect this vaginal tissue and mucosa off the underlying bladder.  The dissection was carried back to the pubic rami.  A 1 cm suprapubic incisions were then created 2 cm to the right and left in the midline.  The Foley catheter was removed.  The TVT Exact was performed in a bottom up fashion.  The urethral guide and Foley tip were placed in the urethra and the urethra was deflected properly as the TVT guide was brought through the right retropubic space and up through the right suprapubic incision.  The urethra was then deflected in the opposite direction and the TVT guide was placed through the left retropubic space and through the left suprapubic ipsilateral incision.  The obturator guide was removed at this time and cystoscopy was  performed and the findings were as noted above.  The bladder was drained of all cystoscopic fluid and the Foley catheter was replaced.  The sling was brought up through the suprapubic incisions bilaterally.  The plastic sheaths were removed as a Kelly clamp was placed between the urethra and the sling.  The sling was noted to be in excellent position.  Excess mesh was trimmed suprapubically.  The vaginal skin edges were trimmed at this time and the anterior vaginal wall was closed with a running lock suture of 2-0 Vicryl.  The suprapubic incisions were closed at the termination of the procedure with Dermabond.  Attention was turned to the posterior vaginal wall at this time.  Allis clamps were used to mark the introitus, the perineal body, and then the posterior vaginal wall mucosa.  The perineal body and the posterior vaginal mucosa were injected with 1% lidocaine with epinephrine 1:100,000.  A triangular wedge of epithelium was excised from the perineal body and the posterior vaginal mucosa was incised vertically in the midline with the Metzenbaum scissors.  Sharp and blunt  dissection were used to dissect the perirectal fascia off the vaginal mucosa bilaterally.  The posterior colporrhaphy was then performed with vertical mattress sutures of 0 Vicryl which reduced the rectocele. Crown stitches of 0 Vicryl were placed along the perineal body.  Rectal exam confirmed the absence of sutures in the rectum.  Excess vaginal mucosa was trimmed and the posterior vaginal mucosa was closed with a running lock suture of 2-0 Vicryl.  This was brought down to the hymenal ring and then under the hymen.  The transverse perineal muscles were reapproximated with a running suture of this 2-0 Vicryl.  The suture was then brought up the perineal body in a subcuticular fashion as for an episiotomy repair.  The knot was tied at the hymen.  Vaginal examination confirmed excellent vaginal support and elevation. Final rectal exam documented the absence of sutures in the rectum.  A gauze packing with Estrace cream was placed inside the vagina.  At this point, the patient was awakened and extubated, and escorted to the recovery room in stable condition.  There were no complications. All needle, instrument, and sponge counts were correct.     Lenard Galloway, M.D.     BES/MEDQ  D:  11/02/2015  T:  11/02/2015  Job:  [id number]

## 2015-11-02 NOTE — OR Nursing (Signed)
Family updated on surgical progress and patient condition at 0930 and 1030

## 2015-11-02 NOTE — Anesthesia Procedure Notes (Signed)
Procedure Name: Intubation Date/Time: 11/02/2015 7:36 AM Performed by: Tobin Chad Pre-anesthesia Checklist: Patient identified, Patient being monitored, Emergency Drugs available, Timeout performed and Suction available Patient Re-evaluated:Patient Re-evaluated prior to inductionOxygen Delivery Method: Circle system utilized and Simple face mask Preoxygenation: Pre-oxygenation with 100% oxygen Intubation Type: IV induction and Inhalational induction Ventilation: Mask ventilation without difficulty Laryngoscope Size: Mac and 3 Grade View: Grade I Tube type: Oral Tube size: 7.0 mm Number of attempts: 1 Placement Confirmation: ETT inserted through vocal cords under direct vision,  breath sounds checked- equal and bilateral and positive ETCO2 Secured at: 23 (com) cm Tube secured with: Tape Dental Injury: Teeth and Oropharynx as per pre-operative assessment

## 2015-11-03 ENCOUNTER — Encounter (HOSPITAL_COMMUNITY): Payer: Self-pay | Admitting: Obstetrics and Gynecology

## 2015-11-03 LAB — BASIC METABOLIC PANEL
ANION GAP: 5 (ref 5–15)
BUN: 13 mg/dL (ref 6–20)
CALCIUM: 8.3 mg/dL — AB (ref 8.9–10.3)
CHLORIDE: 97 mmol/L — AB (ref 101–111)
CO2: 26 mmol/L (ref 22–32)
Creatinine, Ser: 0.94 mg/dL (ref 0.44–1.00)
GFR calc non Af Amer: >60 mL/min (ref >60)
GLUCOSE: 123 mg/dL — AB (ref 65–99)
POTASSIUM: 4.4 mmol/L (ref 3.5–5.1)
Sodium: 128 mmol/L — ABNORMAL LOW (ref 135–145)

## 2015-11-03 LAB — CBC
HEMATOCRIT: 29 % — AB (ref 36.0–46.0)
HEMOGLOBIN: 10.2 g/dL — AB (ref 12.0–15.0)
MCH: 32 pg (ref 26.0–34.0)
MCHC: 35.2 g/dL (ref 30.0–36.0)
MCV: 90.9 fL (ref 78.0–100.0)
Platelets: 136 10*3/uL — ABNORMAL LOW (ref 150–400)
RBC: 3.19 MIL/uL — AB (ref 3.87–5.11)
RDW: 12.8 % (ref 11.5–15.5)
WBC: 6.6 10*3/uL (ref 4.0–10.5)

## 2015-11-03 NOTE — Progress Notes (Signed)
1 Day Post-Op Procedure(s) (LRB): HYSTERECTOMY ABDOMINAL (N/A) SALPINGO OOPHORECTOMY (Bilateral) ABDOMINO SACROCOLPOPEXY with Alyte mesh and Halban's Culdoplasty (N/A) POSTERIOR REPAIR (RECTOCELE)  (N/A) TRANSVAGINAL TAPE (TVT) PROCEDURE exact mid-urethral sling (N/A) CYSTOSCOPY (N/A)  Subjective: Patient reports hungry.  Has ambulated.  Vaginal packing is out.  Good pain control.  Objective: I have reviewed patient's vital signs, intake and output and labs. T max 99.8, BP 130/53, P 74, RR 18 Hgb 10.2. Sodium 128. Glucose 123.  General: alert and cooperative Resp: clear to auscultation bilaterally Cardio: regular rate and rhythm, S1, S2 normal, no murmur, click, rub or gallop GI: soft, non-tender; bowel sounds normal; no masses,  no organomegaly and dressing clean, dry, intact.  Vaginal Bleeding: none Sp incisions intact.  Assessment: s/p Procedure(s) with comments: HYSTERECTOMY ABDOMINAL (N/A) - 4 hours SALPINGO OOPHORECTOMY (Bilateral) ABDOMINO SACROCOLPOPEXY with Alyte mesh and Halban's Culdoplasty (N/A) POSTERIOR REPAIR (RECTOCELE)  (N/A) TRANSVAGINAL TAPE (TVT) PROCEDURE exact mid-urethral sling (N/A) CYSTOSCOPY (N/A): progressing well  Plan: Advance diet Encourage ambulation Advance to PO medication  Repeat CBC and BMP in am.  LOS: 1 day    [employer] 11/03/2015, 7:50 AM

## 2015-11-03 NOTE — Progress Notes (Signed)
Patient c/o of jitteriness, feeling warm and diaphoretic, and feeling increased HR.  Requesting to have CBC and BMP scheduled for AM drawn at this time to assess sodium levels.  Call placed to attending physician, Fontaine to report patient c/o and request. Will draw blood work at this time.  Patient made aware of plan of care and verbalizes content.

## 2015-11-04 ENCOUNTER — Telehealth: Payer: Self-pay | Admitting: Obstetrics & Gynecology

## 2015-11-04 DIAGNOSIS — Z90710 Acquired absence of both cervix and uterus: Secondary | ICD-10-CM

## 2015-11-04 DIAGNOSIS — Z90722 Acquired absence of ovaries, bilateral: Secondary | ICD-10-CM

## 2015-11-04 DIAGNOSIS — Z9079 Acquired absence of other genital organ(s): Secondary | ICD-10-CM

## 2015-11-04 LAB — CBC
HEMATOCRIT: 32.2 % — AB (ref 36.0–46.0)
HEMOGLOBIN: 11.5 g/dL — AB (ref 12.0–15.0)
MCH: 32.2 pg (ref 26.0–34.0)
MCHC: 35.7 g/dL (ref 30.0–36.0)
MCV: 90.2 fL (ref 78.0–100.0)
Platelets: 186 10*3/uL (ref 150–400)
RBC: 3.57 MIL/uL — AB (ref 3.87–5.11)
RDW: 12.4 % (ref 11.5–15.5)
WBC: 9.2 10*3/uL (ref 4.0–10.5)

## 2015-11-04 LAB — BASIC METABOLIC PANEL
ANION GAP: 7 (ref 5–15)
Anion gap: 6 (ref 5–15)
BUN: 11 mg/dL (ref 6–20)
BUN: 14 mg/dL (ref 6–20)
CALCIUM: 9 mg/dL (ref 8.9–10.3)
CO2: 25 mmol/L (ref 22–32)
CO2: 28 mmol/L (ref 22–32)
CREATININE: 0.85 mg/dL (ref 0.44–1.00)
Calcium: 8.5 mg/dL — ABNORMAL LOW (ref 8.9–10.3)
Chloride: 92 mmol/L — ABNORMAL LOW (ref 101–111)
Chloride: 93 mmol/L — ABNORMAL LOW (ref 101–111)
Creatinine, Ser: 0.86 mg/dL (ref 0.44–1.00)
GFR calc Af Amer: >60 mL/min (ref >60)
GFR calc non Af Amer: >60 mL/min (ref >60)
GFR calc non Af Amer: >60 mL/min (ref >60)
GLUCOSE: 129 mg/dL — AB (ref 65–99)
GLUCOSE: 143 mg/dL — AB (ref 65–99)
POTASSIUM: 4.4 mmol/L (ref 3.5–5.1)
Potassium: 4.7 mmol/L (ref 3.5–5.1)
Sodium: 125 mmol/L — ABNORMAL LOW (ref 135–145)
Sodium: 126 mmol/L — ABNORMAL LOW (ref 135–145)

## 2015-11-04 LAB — OSMOLALITY, URINE: Osmolality, Ur: 195 mOsm/kg — ABNORMAL LOW (ref 300–900)

## 2015-11-04 LAB — SODIUM, URINE, RANDOM: SODIUM UR: 45 mmol/L

## 2015-11-04 MED ORDER — OXYCODONE-ACETAMINOPHEN 5-325 MG PO TABS: 1.0000 | ORAL_TABLET | ORAL | Status: DC | PRN

## 2015-11-04 MED ORDER — IBUPROFEN 600 MG PO TABS: 600.0000 mg | ORAL_TABLET | Freq: Four times a day (QID) | ORAL | Status: DC | PRN

## 2015-11-04 MED ORDER — ZOLPIDEM TARTRATE 5 MG PO TABS
5.0000 mg | ORAL_TABLET | Freq: Once | ORAL | Status: AC
  Administered 2015-11-04: 5 mg via ORAL
  Filled 2015-11-04: qty 1

## 2015-11-04 MED FILL — IBUPROFEN 600 MG TABLET: 600 | 7 days supply | Qty: 30 | Fill #0

## 2015-11-04 NOTE — Telephone Encounter (Signed)
Return call to patient. Patient states she is in hospital with low sodium level and is waiting on consult recommended by Dr Quincy Simmonds.  She has been told that consulting MD may not arrive till tomorrow and she is concerned that she isnt doing anything to get sodium level up. She is wondering if perhaps restarting IVF or something could be done while waiting for consult. Advised will relay message to Dr Quincy Simmonds for review.

## 2015-11-04 NOTE — Telephone Encounter (Signed)
Patient is asking to talk with Dr.Miller's nurse regarding her sodium level. Last seen 10/14/15.

## 2015-11-04 NOTE — Progress Notes (Signed)
2 Days Post-Op Procedure(s) (LRB): HYSTERECTOMY ABDOMINAL (N/A) SALPINGO OOPHORECTOMY (Bilateral) ABDOMINO SACROCOLPOPEXY with Alyte mesh and Halban's Culdoplasty (N/A) POSTERIOR REPAIR (RECTOCELE)  (N/A) TRANSVAGINAL TAPE (TVT) PROCEDURE exact mid-urethral sling (N/A) CYSTOSCOPY (N/A)  Subjective: Patient reports no problems voiding.   Patient expressing frustration at her continued hospitalization for the hyponatremia.  She states that this is a chronic issue for her that is related to her fluids.  She reports her sodium level following her colonoscopy was 124.   Objective: I have reviewed patient's vital signs, intake and output and labs. T max 99.5 - T now 98.2, BP 159/92, P 103. I/0: - / 900 cc urine out.   General: alert, cooperative and somewhat anxious. Resp: clear to auscultation bilaterally Cardio: regular rate and rhythm, S1, S2 normal, no murmur, click, rub or gallop and slight systolic murmur. Extremities: no edema of LEs.  Assessment: s/p Procedure(s) with comments: HYSTERECTOMY ABDOMINAL (N/A) - 4 hours SALPINGO OOPHORECTOMY (Bilateral) ABDOMINO SACROCOLPOPEXY with Alyte mesh and Halban's Culdoplasty (N/A) POSTERIOR REPAIR (RECTOCELE)  (N/A) TRANSVAGINAL TAPE (TVT) PROCEDURE exact mid-urethral sling (N/A) CYSTOSCOPY (N/A): voiding well today.  Hyponatremia.  This is a chronic problem for the patient but appears to be exacerbated post op.   Plan:  I have already called for a consultation from the hospitalist this afternoon.   She will be in to see the patient tonight or early tomorrow am.  The hospitalist wanted her to be off her LR IVF, which she has been since yesterday around noon. Urine sodium and osmolarity studies are pending.  BMP in the am.  Continue in hospital care.    LOS: 2 days    Arloa Koh 11/04/2015, 6:21 PM

## 2015-11-04 NOTE — Progress Notes (Signed)
Urine for NA and urine for osmolality was collected and sent to lab,as ordered.

## 2015-11-04 NOTE — Discharge Instructions (Signed)
Abdominal Hysterectomy, Care After °Refer to this sheet in the next few weeks. These instructions provide you with information on caring for yourself after your procedure. Your health care provider may also give you more specific instructions. Your treatment has been planned according to current medical practices, but problems sometimes occur. Call your health care provider if you have any problems or questions after your procedure.  °WHAT TO EXPECT AFTER THE PROCEDURE °After your procedure, it is typical to have the following: °· Pain. °· Feeling tired. °· Poor appetite. °· Less interest in sex. °It takes 4-6 weeks to recover from this surgery.  °HOME CARE INSTRUCTIONS  °· Take pain medicines only as directed by your health care provider. Do not take over-the-counter pain medicines without checking with your health care provider first.  °· Change your bandage as directed by your health care provider. °· Return to your health care provider to have your sutures taken out. °· Take showers instead of baths for 2-3 weeks. Ask your health care provider when it is safe to start showering.  °· Do not douche, use tampons, or have sexual intercourse for at least 6 weeks or until your health care provider says you can.   °· Follow your health care provider's advice about exercise, lifting, driving, and general activities. °· Get plenty of rest and sleep.   °· Do not lift anything heavier than a gallon of milk (about 10 lb [4.5 kg]) for the first month after surgery. °· You can resume your normal diet if your health care provider says it is okay.   °· Do not drink alcohol until your health care provider says you can.   °· If you are constipated, ask your health care provider if you can take a mild laxative. °· Eating foods high in fiber may also help with constipation. Eat plenty of raw fruits and vegetables, whole grains, and beans. °· Drink enough fluids to keep your urine clear or pale yellow.   °· Try to have someone at  home with you for the first 1-2 weeks to help around the house. °· Keep all follow-up appointments. °SEEK MEDICAL CARE IF:  °· You have chills or fever. °· You have swelling, redness, or pain in the area of your incision that is getting worse.   °· You have pus coming from the incision.   °· You notice a bad smell coming from the incision or bandage.   °· Your incision breaks open.   °· You feel dizzy or light-headed.   °· You have pain or bleeding when you urinate.   °· You have persistent diarrhea.   °· You have persistent nausea and vomiting.   °· You have abnormal vaginal discharge.   °· You have a rash.   °· You have any type of abnormal reaction or develop an allergy to your medicine.   °· Your pain medicine is not helping.   °SEEK IMMEDIATE MEDICAL CARE IF:  °· You have a fever and your symptoms suddenly get worse. °· You have severe abdominal pain. °· You have chest pain. °· You have shortness of breath. °· You faint. °· You have pain, swelling, or redness of your leg. °· You have heavy vaginal bleeding with blood clots. °MAKE SURE YOU: °· Understand these instructions. °· Will watch your condition. °· Will get help right away if you are not doing well or get worse. °  °This information is not intended to replace advice given to you by your health care provider. Make sure you discuss any questions you have with your health care provider. °  °Document   Released: 03/24/2005 Document Revised: 09/25/2014 Document Reviewed: 06/27/2013 Elsevier Interactive Patient Education 2016 Elsevier Inc[employer]  Urethral Vaginal Sling, Care After Refer to this sheet in the next few weeks. These instructions provide you with information on caring for yourself after your procedure. Your health care provider may also give you more specific instructions. Your treatment has been planned according to current medical practices, but problems sometimes occur. Call your health care provider if you have any problems or questions after your  procedure.  WHAT TO EXPECT AFTER THE PROCEDURE  After your procedure, it is typical to have the following:  A catheter in your bladder until your bladder is able to work on its own properly. You will be instructed on how to empty the catheter bag.  Absorbable stitches in your incisions. They will slowly dissolve over 1-2 months. HOME CARE INSTRUCTIONS  Get plenty of rest.  Only take over-the-counter or prescription medicines as directed by your health care provider. Do not take aspirin because it can cause bleeding.  Do not take baths. Take showers until your health care provider tells you otherwise.  You may resume your usual diet. Eat a well-balanced diet.  Drink enough fluids to keep your urine clear or pale yellow.  Limit exercise and activities as directed by your health care provider. Do not lift anything heavier than 5 pounds (2.3 kg).  Do not douche, use tampons, or have sexual intercourse for 6 weeks after your procedure.  Follow up with your health care provider as directed. SEEK MEDICAL CARE IF:  You have a heavy or bad smelling vaginal discharge.   You have a rash.   You have pain that is not controlled with medicines.   You have lightheadedness or feel faint.  SEEK IMMEDIATE MEDICAL CARE IF:  You have a fever.   You have vaginal bleeding.   You faint.   You have shortness of breath.   You have chest, abdominal, or leg pain.   You have pain when urinating or cannot urinate.   Your catheter is still in your bladder and becomes blocked.   You have swelling, redness, and pain in the vaginal area.    This information is not intended to replace advice given to you by your health care provider. Make sure you discuss any questions you have with your health care provider.   Document Released: 06/25/2013 Document Reviewed: 06/25/2013 Elsevier Interactive Patient Education 2016 Elsevier Inc.  Anterior and Posterior Colporrhaphy, Sling Procedure,  Care After Refer to this sheet in the next few weeks. These instructions provide you with information on caring for yourself after your procedure. Your health care provider may also give you more specific instructions. Your treatment has been planned according to current medical practices, but problems sometimes occur. Call your health care provider if you have any problems or questions after your procedure.  HOME CARE INSTRUCTIONS  Rest as much as possible during the first 2 weeks after the procedure.   Avoid heavy lifting (more than 10 pounds [4.5 kg]), pushing, or pulling. Limit stair climbing to once or twice a day the first week, then slowly increase this activity.   Avoid standing for prolonged periods of time.   Talk with your health care provider about when you may resume your usual physical activity.   You may resume your normal diet right away.   Drink at least 6-8 glasses of non-caffeinated beverages per day.   Eat a well-balanced diet. Daily portions of food from the meat (protein),  milk, fruit, vegetable, and bread families are necessary for your health.   Your normal bowel function should return. If you become constipated, you may:   Take a mild laxative.  Add fruit and bran to your diet.  Drink more liquids.  You may take a shower and wash your hair.   Only take over-the-counter or prescription medicines as directed by your health care provider.   Clean the incision with water. Do not use a dressing unless the incision is draining or irritated. Check your incision daily for redness, draining, swelling, or separation of the skin.   Follow any bladder care instructions provided by your health care provider.   Keep your perineal area (the area between vagina and rectum) clean and dry. Perform perineal care after every bowel movement and each time you urinate. You may take a sitz bath or sit in a tub of clean, warm water when necessary, unless your health care  provider tells you otherwise.   Do not have sexual intercourse until permitted by your health care provider.   Follow up with your health care provider as directed.  SEEK MEDICAL CARE IF:  You have shaking chills.   Your pain is not relieved with medicine or becomes worse.  You have frequent or urgent urination, or you are unable to completely empty your bladder.   You feel a burning sensation when urinating.   You see pus coming from the wounds.  SEEK IMMEDIATE MEDICAL CARE IF:  You develop a fever.  You notice redness, drainage, swelling, or separation of the skin at the incision site.  You have difficulty breathing.  You are unable to urinate. MAKE SURE YOU:   Understand these instructions.  Will watch your condition.  Will get help right away if you are not doing well or get worse.   This information is not intended to replace advice given to you by your health care provider. Make sure you discuss any questions you have with your health care provider.   Document Released: 04/18/2004 Document Revised: 05/07/2013 Document Reviewed: 01/24/2013 Elsevier Interactive Patient Education [employer].

## 2015-11-04 NOTE — Consult Note (Signed)
  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 but not very clear cut as typically with SIADH U Na > 40 and U osm is > 300 but anything above 100 could be also seen in early SIADH - post op hyponatremia is possible in post op setting due to IVF - fluids were stopped 2/16, OK to orally hydrate if needed  - Would also encourage pt follows up with PCP to have Na level repeated in one week  - Na is up this AM, no further work up needed except outpatient follow up  Will sign off and please call us with other questions, Mart Piggs [phone]  Labs on Admission:  Basic Metabolic Panel:  Recent Labs Lab 11/03/15 0543 11/04/15 0023 11/04/15 1132  NA 128* 126* 125*  K 4.4 4.7 4.4  CL 97* 92* 93*  CO2 26 28 25   GLUCOSE 123* 143* 129*  BUN 13 14 11   CREATININE 0.94 0.85 0.86  CALCIUM 8.3* 9.0 8.5*   CBC:  Recent Labs Lab 11/03/15 0543 11/04/15 0023  WBC 6.6 9.2  HGB 10.2* 11.5*  HCT 29.0* 32.2*  MCV 90.9 90.2  PLT 136* 186    MAGICK-Jimia Gentles [facility] Pager [phone]  If 7PM-7AM, please contact night-coverage [email or web address] Password [facility] 11/04/2015, 8:53 PM

## 2015-11-04 NOTE — Progress Notes (Signed)
2 Days Post-Op Procedure(s) (LRB): HYSTERECTOMY ABDOMINAL (N/A) SALPINGO OOPHORECTOMY (Bilateral) ABDOMINO SACROCOLPOPEXY with Alyte mesh and Halban's Culdoplasty (N/A) POSTERIOR REPAIR (RECTOCELE)  (N/A) TRANSVAGINAL TAPE (TVT) PROCEDURE exact mid-urethral sling (N/A) CYSTOSCOPY (N/A)  Subjective: Patient reports tolerating PO.   Did not feel well last hs and requested to have her CBC and CMP done then instead of this am.  Has chronically low sodium which her provider is aware of and she manages with dietary control. Ate a pickle and now feels better. Foley out. No void yet.  No flatus. Ambulating well.   Objective: I have reviewed patient's vital signs, intake and output and labs. T max 99.5, T now 98.2 I/O - 480 cc/5000 cc Na 126 (128) K 4.7 (4.4) Hgb 11.5 (10.2)  General: alert and cooperative Resp: clear to auscultation bilaterally Cardio: regular rate and rhythm, S1, S2 normal, no murmur, click, rub or gallop GI: soft, non-tender; bowel sounds normal; no masses,  no organomegaly and incision: clean, dry and intact Vaginal Bleeding: minimal  Assessment: s/p Procedure(s) with comments: HYSTERECTOMY ABDOMINAL (N/A) - 4 hours SALPINGO OOPHORECTOMY (Bilateral) ABDOMINO SACROCOLPOPEXY with Alyte mesh and Halban's Culdoplasty (N/A) POSTERIOR REPAIR (RECTOCELE)  (N/A) TRANSVAGINAL TAPE (TVT) PROCEDURE exact mid-urethral sling (N/A) CYSTOSCOPY (N/A): progressing well and hyponatremia - chronic.  Plan: Encourage ambulation Voiding trials this am.   Discharge later today after completes voiding trials.  Percocet, Motrin for pain control.  Follow up in 4 days.  Instructions and precautions given in verbal and written form.    LOS: 2 days    Arloa Koh 11/04/2015, 7:53 AM

## 2015-11-04 NOTE — Telephone Encounter (Signed)
Encounter closed.  Care is being managed in hospital.

## 2015-11-05 ENCOUNTER — Ambulatory Visit: Payer: 59 | Admitting: Internal Medicine

## 2015-11-05 ENCOUNTER — Telehealth: Payer: Self-pay | Admitting: Obstetrics and Gynecology

## 2015-11-05 LAB — BASIC METABOLIC PANEL
ANION GAP: 7 (ref 5–15)
BUN: 12 mg/dL (ref 6–20)
CALCIUM: 8.1 mg/dL — AB (ref 8.9–10.3)
CO2: 27 mmol/L (ref 22–32)
Chloride: 95 mmol/L — ABNORMAL LOW (ref 101–111)
Creatinine, Ser: 0.86 mg/dL (ref 0.44–1.00)
GFR calc non Af Amer: >60 mL/min (ref >60)
Glucose, Bld: 107 mg/dL — ABNORMAL HIGH (ref 65–99)
POTASSIUM: 3.9 mmol/L (ref 3.5–5.1)
Sodium: 129 mmol/L — ABNORMAL LOW (ref 135–145)

## 2015-11-05 MED FILL — OXYCODONE/APAP 5/325MG: 5-325 | 3 days supply | Qty: 30 | Fill #0

## 2015-11-05 NOTE — Telephone Encounter (Signed)
Sarah Rangel called to say that patient's stay last night got approved by insurance. CC: Dr. Quincy Simmonds

## 2015-11-05 NOTE — Telephone Encounter (Signed)
Thank you for the update.  Cc- Suzy Dixon 

## 2015-11-05 NOTE — Progress Notes (Signed)
Pt is discharged in the care of husband. Downstairs per ambulatory with N,T. Escort. Denies any pain or discomfort. Spirts are good. Abdominal incision is clean and dry. Voiding without difficulty. Stable. No equipment needed for home use.Marland Kitchen

## 2015-11-05 NOTE — Progress Notes (Signed)
3 Days Post-Op Procedure(s) (LRB): HYSTERECTOMY ABDOMINAL (N/A) SALPINGO OOPHORECTOMY (Bilateral) ABDOMINO SACROCOLPOPEXY with Alyte mesh and Halban's Culdoplasty (N/A) POSTERIOR REPAIR (RECTOCELE)  (N/A) TRANSVAGINAL TAPE (TVT) PROCEDURE exact mid-urethral sling (N/A) CYSTOSCOPY (N/A)  Subjective: Patient reports + flatus and no problems voiding.    Patient not discharged yesterday due to low sodium. States she feels fine today.   Does not feel shaky as she felt the night prior. Reports she can usually tell when her sodium goes low.  Hospitalist not here yesterday.   Objective: I have reviewed patient's vital signs, intake and output, labs and pathology. Tm 99.3, T now 98.1, BP 135/65, P 74, RR 14 Na - 129. K - 3.9. Chloride - 95. Glucose - 107. Pathology report - benign uterus, cervix, bilateral tubes and ovaries.  Urine osmolarity - 195 Urine sodium random - 45  General: alert and cooperative Resp: clear to auscultation bilaterally Cardio: regular rate and rhythm, S1, S2 normal, no murmur, click, rub or gallop and mild systolic murmur. GI: soft, non-tender; bowel sounds normal; no masses,  no organomegaly and incision: clean, dry, intact and suprapubic incisions intact. Vaginal Bleeding: minimal  Assessment: s/p Procedure(s) with comments: HYSTERECTOMY ABDOMINAL (N/A) - 4 hours SALPINGO OOPHORECTOMY (Bilateral) ABDOMINO SACROCOLPOPEXY with Alyte mesh and Halban's Culdoplasty (N/A) POSTERIOR REPAIR (RECTOCELE)  (N/A) TRANSVAGINAL TAPE (TVT) PROCEDURE exact mid-urethral sling (N/A) CYSTOSCOPY (N/A): progressing well and hyponatremia normalizing.  Plan: Discharge home  Internist to be in to see patient today.  Will allow discharge by 10:00 if internist is not able to see patient by then.  The patient will follow up with her PCP regarding her low sodium. She has already received her post op discharge instructions.  The patient will follow up in my office for her first  post op check in 4 days.  She has Rx for Percocet and Motrin.     LOS: 3 days    Arloa Koh 11/05/2015, 7:51 AM

## 2015-11-08 ENCOUNTER — Encounter: Payer: Self-pay | Admitting: Obstetrics and Gynecology

## 2015-11-08 ENCOUNTER — Ambulatory Visit (INDEPENDENT_AMBULATORY_CARE_PROVIDER_SITE_OTHER): Payer: 59 | Admitting: Obstetrics and Gynecology

## 2015-11-08 VITALS — BP 178/80 | HR 100 | Resp 20 | Wt 170.0 lb

## 2015-11-08 DIAGNOSIS — E871 Hypo-osmolality and hyponatremia: Secondary | ICD-10-CM

## 2015-11-08 DIAGNOSIS — Z9889 Other specified postprocedural states: Secondary | ICD-10-CM

## 2015-11-08 NOTE — Progress Notes (Signed)
GYNECOLOGY  VISIT   HPI: 60 y.o.   Married  white  female   [phone] with Patient's last menstrual period was 09/18/2008 (approximate).   here for   1 week post op.  Procedure: HYSTERECTOMY ABDOMINAL (N/A )  SALPINGO OOPHORECTOMY (Bilateral ) [id number] CPT (R)]  ABDOMINO SACROCOLPOPEXY with Alyte mesh and Halban's Culdoplasty (N/A ) POSTERIOR REPAIR (RECTOCELE) (N/A )  TRANSVAGINAL TAPE (TVT) PROCEDURE exact mid-urethral sling (N/A )  CYSTOSCOPY (N/A )   Patient had benign cervix, uterus, tubes and ovaries on pathology report.   Had low sodium in the hospital, which is a chronic issue for her.  Discharge Na 129.    States that the medicine consult did not arrive to do consultation by 10:00.  I did allow her to be discharged at this time if the consultant had not appeared yet.  Good pain control.  Taking one Percocet at at time.  Taking ibuprofen every 6 hours.  First BM this am.  Voiding well, smaller amounts and more frequently. No real bleeding.    GYNECOLOGIC HISTORY: Patient's last menstrual period was 09/18/2008 (approximate). Contraception:Hysterectomy Menopausal hormone therapy: None Last mammogram: 04/12/15 BIRADS Category 1 Neg, 09/08/15 Left side BIRADS Category 1 neg Last pap smear: 08/27/14 Neg HR HPV Neg        OB History    Gravida Para Term Preterm AB TAB SAB Ectopic Multiple Living   2 2 2       2          Patient Active Problem List   Diagnosis Date Noted  . Status post total abdominal hysterectomy and bilateral salpingo-oophorectomy 11/02/2015  . Worsening headaches 12/05/2013  . Unspecified essential hypertension 11/06/2013  . Stress 11/06/2013    Past Medical History  Diagnosis Date  . History of cystocele   . Hypertension   . Anxiety   . History of shingles 2015    two occurances    Past Surgical History  Procedure Laterality Date  . Wisdom tooth extraction      age 69  . Abdominal hysterectomy N/A 11/02/2015    Procedure:  HYSTERECTOMY ABDOMINAL;  Surgeon: Nunzio Cobbs, MD;  Location: [facility];  Service: Gynecology;  Laterality: N/A;  4 hours  . Salpingoophorectomy Bilateral 11/02/2015    Procedure: SALPINGO OOPHORECTOMY;  Surgeon: Nunzio Cobbs, MD;  Location: [facility];  Service: Gynecology;  Laterality: Bilateral;  . Abdominal sacrocolpopexy N/A 11/02/2015    Procedure: ABDOMINO SACROCOLPOPEXY with Alyte mesh and Halban's Culdoplasty;  Surgeon: Nunzio Cobbs, MD;  Location: [facility];  Service: Gynecology;  Laterality: N/A;  . Anterior and posterior repair N/A 11/02/2015    Procedure: POSTERIOR REPAIR (RECTOCELE) ;  Surgeon: Nunzio Cobbs, MD;  Location: [facility];  Service: Gynecology;  Laterality: N/A;  . Bladder suspension N/A 11/02/2015    Procedure: TRANSVAGINAL TAPE (TVT) PROCEDURE exact mid-urethral sling;  Surgeon: Nunzio Cobbs, MD;  Location: [facility];  Service: Gynecology;  Laterality: N/A;  . Cystoscopy N/A 11/02/2015    Procedure: CYSTOSCOPY;  Surgeon: Nunzio Cobbs, MD;  Location: [facility];  Service: Gynecology;  Laterality: N/A;    Current Outpatient Prescriptions  Medication Sig Dispense Refill  . diphenhydrAMINE (BENADRYL) 25 mg capsule Take 25 mg by mouth every 6 (six) hours as needed for sleep.    . hydrALAZINE (APRESOLINE) 10 MG tablet Take 2 tablets (20 mg total) by mouth 3 (three) times daily. [place]  tablet 3  . ibuprofen (ADVIL,MOTRIN) 600 MG tablet Take 1 tablet (600 mg total) by mouth every 6 (six) hours as needed (mild pain). 30 tablet 0  . LORazepam (ATIVAN) 0.5 MG tablet TAKE 1/2 TO 1 TABLET BY MOUTH TWICE DAILY AS NEEDED FOR OCCASIONAL ANXIETY 30 tablet 0  . NIFEdipine (NIFEDICAL XL) 30 MG 24 hr tablet Take 1 tablet (30 mg total) by mouth 2 (two) times daily. (Patient taking differently: Take 60 mg by mouth daily. ) 180 tablet 3  . olmesartan (BENICAR) 40 MG tablet Take 1 tablet (40 mg total) by mouth daily. 90 tablet 3  .  oxyCODONE-acetaminophen (PERCOCET/ROXICET) 5-325 MG tablet Take 1-2 tablets by mouth every 4 (four) hours as needed for severe pain (moderate to severe pain (when tolerating fluids)). 30 tablet 0   No current facility-administered medications for this visit.     ALLERGIES: Review of patient's allergies indicates no known allergies.  Family History  Problem Relation Age of Onset  . Hypertension Father   . Colon cancer Neg Hx   . Pancreatic cancer Neg Hx   . Stomach cancer Neg Hx   . Brain cancer Mother   . Lymphoma Mother     Social History   Social History  . Marital Status: Married    Spouse Name: N/A  . Number of Children: N/A  . Years of Education: N/A   Occupational History  . Not on file.   Social History Main Topics  . Smoking status: Former Smoker    Types: Cigarettes  . Smokeless tobacco: Never Used  . Alcohol Use: 1.2 oz/week    2 Standard drinks or equivalent per week     Comment: occasionally 1 time monthly  . Drug Use: No  . Sexual Activity:    Partners: Male    Birth Control/ Protection: Other-see comments, Post-menopausal     Comment: vasectomy   Other Topics Concern  . Not on file   Social History Narrative    ROS:  Pertinent items are noted in HPI.  PHYSICAL EXAMINATION:    BP 178/80 mmHg  Pulse 100  Resp 20  Wt 170 lb (77.111 kg)  LMP 09/18/2008 (Approximate)    General appearance: alert, cooperative and appears stated age Abdomen: incision intact, steristrips on, soft, non-tender;   no masses,  no organomegaly   Pelvic: External genitalia:   Ecchymoses of the suprapubic region. No induration.  SP incisions intact.  Bimanual Exam:   Excellent support.  Sling protected.  Sacrocolpopexy mesh protected.   Good support.  No agglutination.                 Chaperone was present for exam.  ASSESSMENT  Doing well overall status post surgery. Elevated blood pressure today.  Chronic hyponatremia.  PLAN  Surgical procedure reviewed with  patient.  Questions answered.  Will check sodium again today.  Patient will follow up with her PCP.  Continue decreased activity.  Post op in 6 weeks.    An After Visit Summary was printed and given to the patient.

## 2015-11-09 LAB — BASIC METABOLIC PANEL
BUN: 15 mg/dL (ref 7–25)
CO2: 23 mmol/L (ref 20–31)
Calcium: 9.2 mg/dL (ref 8.6–10.4)
Chloride: 96 mmol/L — ABNORMAL LOW (ref 98–110)
Creat: 0.79 mg/dL (ref 0.50–0.99)
GLUCOSE: 120 mg/dL — AB (ref 65–99)
POTASSIUM: 4.5 mmol/L (ref 3.5–5.3)
Sodium: 134 mmol/L — ABNORMAL LOW (ref 135–146)

## 2015-11-11 ENCOUNTER — Ambulatory Visit (INDEPENDENT_AMBULATORY_CARE_PROVIDER_SITE_OTHER): Payer: 59 | Admitting: Obstetrics and Gynecology

## 2015-11-11 ENCOUNTER — Encounter: Payer: Self-pay | Admitting: Obstetrics and Gynecology

## 2015-11-11 VITALS — BP 184/80 | HR 96 | Ht 66.0 in | Wt 170.2 lb

## 2015-11-11 DIAGNOSIS — N949 Unspecified condition associated with female genital organs and menstrual cycle: Secondary | ICD-10-CM

## 2015-11-11 DIAGNOSIS — R102 Pelvic and perineal pain: Secondary | ICD-10-CM

## 2015-11-11 MED ORDER — OXYCODONE-ACETAMINOPHEN 5-325 MG PO TABS: 1.0000 | ORAL_TABLET | ORAL | Status: DC | PRN

## 2015-11-11 MED FILL — OXYCODONE/APAP 5/325MG: 5-325 | 3 days supply | Qty: 30 | Fill #0

## 2015-11-11 NOTE — Patient Instructions (Signed)
Try a sitz bath and a peribottle to spray on your bottom to reduce the discomfort.

## 2015-11-11 NOTE — Progress Notes (Signed)
Patient ID: Sarah Rangel, female   DOB: 08-01-1956, 60 y.o.   MRN: [id number] GYNECOLOGY  VISIT   HPI: 60 y.o.   Married  Caucasian  female   G2P2002 with Patient's last menstrual period was 09/18/2008 (approximate).   here for possible loose suture in perineal area, 1 week post HYSTERECTOMY ABDOMINAL (N/A )  SALPINGO OOPHORECTOMY (Bilateral ) [id number] CPT (R)]  ABDOMINO SACROCOLPOPEXY with Alyte mesh and Halban's Culdoplasty (N/A )  POSTERIOR REPAIR (RECTOCELE) (N/A )  TRANSVAGINAL TAPE (TVT) PROCEDURE exact mid-urethral sling (N/A ) CYSTOSCOPY (N/A ).   Perineum is sore.  Almost out of Percocet.  No vaginal bleeding.  Avoiding lifting.   GYNECOLOGIC HISTORY: Patient's last menstrual period was 09/18/2008 (approximate). Contraception:Vasectomy/postmenopausal/TAH/BSO Menopausal hormone therapy: none Last mammogram: 04-13-15 Density Cat.B/Neg/BiRads1:[facility] Last pap smear: 08-19-14 Neg:Neg HR HPV        OB History    Gravida Para Term Preterm AB TAB SAB Ectopic Multiple Living   2 2 2       2          Patient Active Problem List   Diagnosis Date Noted  . Status post total abdominal hysterectomy and bilateral salpingo-oophorectomy 11/02/2015  . Worsening headaches 12/05/2013  . Unspecified essential hypertension 11/06/2013  . Stress 11/06/2013    Past Medical History  Diagnosis Date  . History of cystocele   . Hypertension   . Anxiety   . History of shingles 2015    two occurances    Past Surgical History  Procedure Laterality Date  . Wisdom tooth extraction      age 39  . Abdominal hysterectomy N/A 11/02/2015    Procedure: HYSTERECTOMY ABDOMINAL;  Surgeon: Nunzio Cobbs, MD;  Location: [facility];  Service: Gynecology;  Laterality: N/A;  4 hours  . Salpingoophorectomy Bilateral 11/02/2015    Procedure: SALPINGO OOPHORECTOMY;  Surgeon: Nunzio Cobbs, MD;  Location: [facility];  Service: Gynecology;  Laterality: Bilateral;  . Abdominal  sacrocolpopexy N/A 11/02/2015    Procedure: ABDOMINO SACROCOLPOPEXY with Alyte mesh and Halban's Culdoplasty;  Surgeon: Nunzio Cobbs, MD;  Location: [facility];  Service: Gynecology;  Laterality: N/A;  . Anterior and posterior repair N/A 11/02/2015    Procedure: POSTERIOR REPAIR (RECTOCELE) ;  Surgeon: Nunzio Cobbs, MD;  Location: [facility];  Service: Gynecology;  Laterality: N/A;  . Bladder suspension N/A 11/02/2015    Procedure: TRANSVAGINAL TAPE (TVT) PROCEDURE exact mid-urethral sling;  Surgeon: Nunzio Cobbs, MD;  Location: [facility];  Service: Gynecology;  Laterality: N/A;  . Cystoscopy N/A 11/02/2015    Procedure: CYSTOSCOPY;  Surgeon: Nunzio Cobbs, MD;  Location: [facility];  Service: Gynecology;  Laterality: N/A;    Current Outpatient Prescriptions  Medication Sig Dispense Refill  . diphenhydrAMINE (BENADRYL) 25 mg capsule Take 25 mg by mouth every 6 (six) hours as needed for sleep.    . hydrALAZINE (APRESOLINE) 10 MG tablet Take 2 tablets (20 mg total) by mouth 3 (three) times daily. 540 tablet 3  . ibuprofen (ADVIL,MOTRIN) 600 MG tablet Take 1 tablet (600 mg total) by mouth every 6 (six) hours as needed (mild pain). 30 tablet 0  . LORazepam (ATIVAN) 0.5 MG tablet TAKE 1/2 TO 1 TABLET BY MOUTH TWICE DAILY AS NEEDED FOR OCCASIONAL ANXIETY 30 tablet 0  . NIFEdipine (NIFEDICAL XL) 30 MG 24 hr tablet Take 1 tablet (30 mg total) by mouth 2 (two) times daily. (  Patient taking differently: Take 60 mg by mouth daily. ) 180 tablet 3  . olmesartan (BENICAR) 40 MG tablet Take 1 tablet (40 mg total) by mouth daily. 90 tablet 3  . oxyCODONE-acetaminophen (PERCOCET/ROXICET) 5-325 MG tablet Take 1-2 tablets by mouth every 4 (four) hours as needed for severe pain (moderate to severe pain (when tolerating fluids)). 30 tablet 0   No current facility-administered medications for this visit.     ALLERGIES: Review of patient's allergies indicates no known allergies.  Family  History  Problem Relation Age of Onset  . Hypertension Father   . Colon cancer Neg Hx   . Pancreatic cancer Neg Hx   . Stomach cancer Neg Hx   . Brain cancer Mother   . Lymphoma Mother     Social History   Social History  . Marital Status: Married    Spouse Name: N/A  . Number of Children: N/A  . Years of Education: N/A   Occupational History  . Not on file.   Social History Main Topics  . Smoking status: Former Smoker    Types: Cigarettes  . Smokeless tobacco: Never Used  . Alcohol Use: 1.2 oz/week    2 Standard drinks or equivalent per week     Comment: occasionally 1 time monthly  . Drug Use: No  . Sexual Activity:    Partners: Male    Birth Control/ Protection: Other-see comments, Post-menopausal, Surgical     Comment: vasectomy/TAH/BSO   Other Topics Concern  . Not on file   Social History Narrative    ROS:  Pertinent items are noted in HPI.  PHYSICAL EXAMINATION:    BP 184/80 mmHg  Pulse 96  Ht 5\' 6"  (1.676 m)  Wt 170 lb 3.2 oz (77.202 kg)  BMI 27.48 kg/m2  LMP 09/18/2008 (Approximate)    General appearance: alert, cooperative and appears stated age   Abdomen: incision intact, soft, non-tender; no masses,  no organomegaly   Pelvic: External genitalia:  Suprapubic incisions intact with ecchymoses and no induration. Suture knots visible on anterior vaginal wall below the urethra and at the perineal body.  Post surgical sutures and repair explained to patient using a mirror.                           Bimanual Exam:  Uterus:  uterus absent              Adnexa: no mass, fullness, tenderness               Vaginal suture lines intact.  No palpable sling or mesh.                Chaperone was present for exam.  ASSESSMENT  Perineal pain.  Normal post op healing.   PLAN  Counseled regarding anatomy and post op healing.  Rx for Percocet.  Discussed sitz baths and pericare using a bottle to spray when voiding and after.  I do not recommend using any  products on suture lines at this time. Follow up for 6 week post op visit.   An After Visit Summary was printed and given to the patient.  _15_____ minutes face to face time of which over 50% was spent in counseling.

## 2015-11-15 NOTE — Discharge Summary (Signed)
Physician Discharge Summary  Patient ID: Sarah Rangel MRN: [id number] DOB/AGE: 1956/04/19 60 y.o.  Admit date: 11/02/2015 Discharge date:  11/05/15 Admission Diagnoses: 1.  Complete uterovaginal prolapse.  2.  Hyponatremia.  Discharge Diagnoses:  1.  Complete uterovaginal prolapse.  2.  Status post total abdominal hysterectomy with bilateral salpingo-oophorectomy, abdominal sacrocolpopexy, Halban's culdoplasty, TVT Exact midurethral sling, cystoscopy, and posterior colporrhaphy. 3.  Hyponatremia.   Active Problems:   Status post total abdominal hysterectomy and bilateral salpingo-oophorectomy   Discharged Condition: good  Hospital Course:  The patient was admitted on 11/02/15 for a total abdominal hysterectomy with bilateral salpingo-oophorectomy, abdominal sacrocolpopexy, Halban's culdoplasty, TVT Exact midurethral sling, cystoscopy, and posterior colporrhaphy which were performed without complication while under general anesthesia.  She had a morphine PCA and Toradol for pain control initially, and this was converted over to Percocet and Motrin on post op day one when the patient began taking po well.  She ambulated independently and wore PAS and Ted hose for DVT prophylaxis while in bed.  Her foley catheter were removed on post op day two, and she voided well with low residual volumes. The patient's vital signs showed elevated blood pressure periodically, and she was treated with her usual antihypertensive medication.  She demonstrated no signs of infection during her hospitalization.  The patient's post op day one Hgb was 10.2 and her sodium was 128.  She was tolerating the this well.  She had very minimal vaginal bleeding, and her incision(s) demonstrated no signs of erythema or significant drainage.  Early in the morning of post op day two, the patient felt jittery and diaphoretic and like her sodium was low.  She had a CBC and BMP drawn, and this showed a hemoglobin of 11.5 and a sodium  of 126.  She ate a pickle and states she felt better after this. The patient has known hyponatremia which she is managing by diet.  The patient continued under observation and she had an addition sodium level drawn that same afternoon, and this returned at 125.  At this time, an internal medicine consultation was requested from the hospitalist service.  The covering provider requested a random urine sodium which measured 45 and urine osmolarity which measured 195.  The patient was stable and continued inpatient care.  Her sodium measured 129 on the morning of post op day three.  She was allowed discharge at 10:00 am.  She was found to be in good condition and ready for discharge on post op day three.     Consults: None.  Internal medicine consultation was requested but not performed prior to the patient's discharge.  Significant Diagnostic Studies: labs:  Please see Hospital Course.  Treatments: surgery:  total abdominal hysterectomy with bilateral salpingo-oophorectomy, abdominal sacrocolpopexy, Halban's culdoplasty, TVT Exact midurethral sling, cystoscopy, and posterior colporrhaphy.   Discharge Exam: Blood pressure 136/65, pulse 74, temperature 98.1 F (36.7 C), temperature source Oral, resp. rate 14, height 5\' 6"  (1.676 m), weight 172 lb (78.019 kg), last menstrual period 09/18/2008, SpO2 98 %. General: alert and cooperative Resp: clear to auscultation bilaterally Cardio: regular rate and rhythm, S1, S2 normal, no murmur, click, rub or gallop and mild systolic murmur. GI: soft, non-tender; bowel sounds normal; no masses, no organomegaly and incision: clean, dry, intact and suprapubic incisions intact. Vaginal Bleeding: minimal  Disposition: 01-Home or Self Care  The patient was given discharge instructions in verbal and written form.     Medication List    STOP taking these medications  acetaminophen 500 MG tablet  Commonly known as:  TYLENOL      TAKE these medications         diphenhydrAMINE 25 mg capsule  Commonly known as:  BENADRYL  Take 25 mg by mouth every 6 (six) hours as needed for sleep.     hydrALAZINE 10 MG tablet  Commonly known as:  APRESOLINE  Take 2 tablets (20 mg total) by mouth 3 (three) times daily.     ibuprofen 600 MG tablet  Commonly known as:  ADVIL,MOTRIN  Take 1 tablet (600 mg total) by mouth every 6 (six) hours as needed (mild pain).     LORazepam 0.5 MG tablet  Commonly known as:  ATIVAN  TAKE 1/2 TO 1 TABLET BY MOUTH TWICE DAILY AS NEEDED FOR OCCASIONAL ANXIETY     NIFEdipine 30 MG 24 hr tablet  Commonly known as:  NIFEDICAL XL  Take 1 tablet (30 mg total) by mouth 2 (two) times daily.     olmesartan 40 MG tablet  Commonly known as:  BENICAR  Take 1 tablet (40 mg total) by mouth daily.           Follow-up Information    Follow up with Arloa Koh, MD In 4 days.   Specialty:  Obstetrics and Gynecology   Contact information:   [address] [place] [address] [phone]       Signed: Arloa Koh 11/15/2015, 6:42 PM

## 2015-12-06 MED FILL — hydrALAZINE HCL 10 MG TABS: 10 | 90 days supply | Qty: 540 | Fill #1

## 2015-12-07 DIAGNOSIS — H52223 Regular astigmatism, bilateral: Secondary | ICD-10-CM | POA: Diagnosis not present

## 2015-12-07 DIAGNOSIS — H524 Presbyopia: Secondary | ICD-10-CM | POA: Diagnosis not present

## 2015-12-07 DIAGNOSIS — H5203 Hypermetropia, bilateral: Secondary | ICD-10-CM | POA: Diagnosis not present

## 2015-12-08 ENCOUNTER — Ambulatory Visit (INDEPENDENT_AMBULATORY_CARE_PROVIDER_SITE_OTHER): Payer: 59 | Admitting: Obstetrics and Gynecology

## 2015-12-08 ENCOUNTER — Encounter: Payer: Self-pay | Admitting: Obstetrics and Gynecology

## 2015-12-08 VITALS — BP 180/80 | HR 80 | Wt 171.6 lb

## 2015-12-08 DIAGNOSIS — Z9889 Other specified postprocedural states: Secondary | ICD-10-CM

## 2015-12-08 NOTE — Progress Notes (Signed)
Patient ID: Sarah Rangel, female   DOB: [birth year], 60 y.o.   MRN: [id number] GYNECOLOGY  VISIT   HPI: 60 y.o.   Married  Caucasian  female   G2P2002 with Patient's last menstrual period was 09/18/2008 (approximate).   here for 5 week follow up HYSTERECTOMY ABDOMINAL (N/A )  SALPINGO OOPHORECTOMY (Bilateral ) [id number] CPT(R)ABDOMINO SACROCOLPOPEXY with Alyte mesh and Halban's Culdoplasty (N/A )  POSTERIOR REPAIR (RECTOCELE) (N/A )  TRANSVAGINAL TAPE (TVT) PROCEDURE exact mid-urethral sling (N/A ) CYSTOSCOPY (N/A )   Has some lower back discomfort and left lower sided incisional pain.  No vaginal bleeding.  Good bladder and bowel function.  Taking Tylenol 500 mg po q 8 hours.   Scheduled to return to work in 6 days.  Ready to return to work.   BP at home is usually - 120 - 130/60 - 70 per patient.  (Patient is a cardiology nurse.)  GYNECOLOGIC HISTORY: Patient's last menstrual period was 09/18/2008 (approximate). Contraception:Vasectomy/Postmenopausal/Hysterectomy Menopausal hormone therapy: none Last mammogram: 04-13-15 Density Cat.B/Neg/BiRads1:[facility] Last pap smear: 08-19-14 Neg:Neg HR HPV        OB History    Gravida Para Term Preterm AB TAB SAB Ectopic Multiple Living   2 2 2       2          Patient Active Problem List   Diagnosis Date Noted  . Status post total abdominal hysterectomy and bilateral salpingo-oophorectomy 11/02/2015  . Worsening headaches 12/05/2013  . Unspecified essential hypertension 11/06/2013  . Stress 11/06/2013    Past Medical History  Diagnosis Date  . History of cystocele   . Hypertension   . Anxiety   . History of shingles 2015    two occurances    Past Surgical History  Procedure Laterality Date  . Wisdom tooth extraction      age 36  . Abdominal hysterectomy N/A 11/02/2015    Procedure: HYSTERECTOMY ABDOMINAL;  Surgeon: Nunzio Cobbs, MD;  Location: [facility];  Service: Gynecology;  Laterality: N/A;  4 hours   . Salpingoophorectomy Bilateral 11/02/2015    Procedure: SALPINGO OOPHORECTOMY;  Surgeon: Nunzio Cobbs, MD;  Location: [facility];  Service: Gynecology;  Laterality: Bilateral;  . Abdominal sacrocolpopexy N/A 11/02/2015    Procedure: ABDOMINO SACROCOLPOPEXY with Alyte mesh and Halban's Culdoplasty;  Surgeon: Nunzio Cobbs, MD;  Location: [facility];  Service: Gynecology;  Laterality: N/A;  . Anterior and posterior repair N/A 11/02/2015    Procedure: POSTERIOR REPAIR (RECTOCELE) ;  Surgeon: Nunzio Cobbs, MD;  Location: [facility];  Service: Gynecology;  Laterality: N/A;  . Bladder suspension N/A 11/02/2015    Procedure: TRANSVAGINAL TAPE (TVT) PROCEDURE exact mid-urethral sling;  Surgeon: Nunzio Cobbs, MD;  Location: [facility];  Service: Gynecology;  Laterality: N/A;  . Cystoscopy N/A 11/02/2015    Procedure: CYSTOSCOPY;  Surgeon: Nunzio Cobbs, MD;  Location: [facility];  Service: Gynecology;  Laterality: N/A;    Current Outpatient Prescriptions  Medication Sig Dispense Refill  . diphenhydrAMINE (BENADRYL) 25 mg capsule Take 25 mg by mouth every 6 (six) hours as needed for sleep.    . hydrALAZINE (APRESOLINE) 10 MG tablet Take 2 tablets (20 mg total) by mouth 3 (three) times daily. 540 tablet 3  . ibuprofen (ADVIL,MOTRIN) 600 MG tablet Take 1 tablet (600 mg total) by mouth every 6 (six) hours as needed (mild pain). 30 tablet 0  . LORazepam (  ATIVAN) 0.5 MG tablet TAKE 1/2 TO 1 TABLET BY MOUTH TWICE DAILY AS NEEDED FOR OCCASIONAL ANXIETY 30 tablet 0  . NIFEdipine (NIFEDICAL XL) 30 MG 24 hr tablet Take 1 tablet (30 mg total) by mouth 2 (two) times daily. (Patient taking differently: Take 60 mg by mouth daily. ) 180 tablet 3  . olmesartan (BENICAR) 40 MG tablet Take 1 tablet (40 mg total) by mouth daily. 90 tablet 3  . oxyCODONE-acetaminophen (PERCOCET/ROXICET) 5-325 MG tablet Take 1-2 tablets by mouth every 4 (four) hours as needed for severe pain (moderate to  severe pain (when tolerating fluids)). (Patient not taking: Reported on 12/08/2015) 30 tablet 0   No current facility-administered medications for this visit.     ALLERGIES: Review of patient's allergies indicates no known allergies.  Family History  Problem Relation Age of Onset  . Hypertension Father   . Colon cancer Neg Hx   . Pancreatic cancer Neg Hx   . Stomach cancer Neg Hx   . Brain cancer Mother   . Lymphoma Mother     Social History   Social History  . Marital Status: Married    Spouse Name: N/A  . Number of Children: N/A  . Years of Education: N/A   Occupational History  . Not on file.   Social History Main Topics  . Smoking status: Former Smoker    Types: Cigarettes  . Smokeless tobacco: Never Used  . Alcohol Use: 1.2 oz/week    2 Standard drinks or equivalent per week     Comment: occasionally 1 time monthly  . Drug Use: No  . Sexual Activity:    Partners: Male    Birth Control/ Protection: Other-see comments, Post-menopausal, Surgical     Comment: vasectomy/TAH/BSO   Other Topics Concern  . Not on file   Social History Narrative    ROS:  Pertinent items are noted in HPI.  PHYSICAL EXAMINATION:    BP 180/80 mmHg  Pulse 80  Wt 171 lb 9.6 oz (77.837 kg)  LMP 09/18/2008 (Approximate)    General appearance: alert, cooperative and appears stated age   Abdomen: Pfannenstiel incision intact, soft, non-tender; no masses,  no organomegaly   No abnormal inguinal nodes palpated    Pelvic: External genitalia:  no lesions              Urethra:  normal appearing urethra with no masses, tenderness or lesions              Bartholins and Skenes: normal                 Vagina: normal appearing vagina with normal color and discharge, no lesions.  Anterior vaginal wall, vaginal cuff, and posterior vaginal wall sutures present and no evidence of visible or palpable mesh.               Cervix: absent             Bimanual Exam:  Uterus:  uterus absent               Adnexa: no mass, fullness, tenderness             Chaperone was present for exam.  ASSESSMENT  Doing well post op.  HTN.   PLAN  Surgical procedure reviewed with patient.  Counseled regarding post op physical activity.  Note for return to work with lifting restrictions. Follow up in 6 weeks for final post op visit.  Continue to monitor BP at home.  Patient is in close contact with her PCP regarding this.   An After Visit Summary was printed and given to the patient.

## 2015-12-12 ENCOUNTER — Encounter: Payer: Self-pay | Admitting: Obstetrics and Gynecology

## 2015-12-13 ENCOUNTER — Ambulatory Visit: Payer: 59 | Admitting: Obstetrics and Gynecology

## 2015-12-22 MED FILL — NIFEDIPINE ER 30 MG TABLET: 30 | 90 days supply | Qty: 180 | Fill #3

## 2015-12-29 ENCOUNTER — Ambulatory Visit (INDEPENDENT_AMBULATORY_CARE_PROVIDER_SITE_OTHER): Payer: 59 | Admitting: Family Medicine

## 2015-12-29 ENCOUNTER — Encounter: Payer: Self-pay | Admitting: Family Medicine

## 2015-12-29 VITALS — BP 180/78 | HR 96 | Temp 98.1°F | Resp 16 | Ht 66.0 in | Wt 168.0 lb

## 2015-12-29 DIAGNOSIS — IMO0001 Reserved for inherently not codable concepts without codable children: Secondary | ICD-10-CM

## 2015-12-29 DIAGNOSIS — R030 Elevated blood-pressure reading, without diagnosis of hypertension: Secondary | ICD-10-CM

## 2015-12-29 DIAGNOSIS — J0110 Acute frontal sinusitis, unspecified: Secondary | ICD-10-CM | POA: Diagnosis not present

## 2015-12-29 DIAGNOSIS — I10 Essential (primary) hypertension: Secondary | ICD-10-CM | POA: Insufficient documentation

## 2015-12-29 DIAGNOSIS — R062 Wheezing: Secondary | ICD-10-CM | POA: Diagnosis not present

## 2015-12-29 DIAGNOSIS — J208 Acute bronchitis due to other specified organisms: Secondary | ICD-10-CM

## 2015-12-29 MED ORDER — ALBUTEROL SULFATE HFA 108 (90 BASE) MCG/ACT IN AERS: 2.0000 | INHALATION_SPRAY | Freq: Four times a day (QID) | RESPIRATORY_TRACT | Status: DC | PRN

## 2015-12-29 MED ORDER — DOXYCYCLINE HYCLATE 100 MG PO CAPS: 100.0000 mg | ORAL_CAPSULE | Freq: Two times a day (BID) | ORAL | Status: DC

## 2015-12-29 MED FILL — DOXYCYCLINE HYC 100 MG CAP: 100 | 10 days supply | Qty: 20 | Fill #0

## 2015-12-29 MED FILL — OLMESARTAN MEDOXOMIL 40 MG: 40 | 30 days supply | Qty: 30 | Fill #0

## 2015-12-29 NOTE — Progress Notes (Signed)
[facility] [address] [place], [place] [address] [phone]  Date:  12/29/2015   Name:  Sarah Rangel   DOB:  12/09/1955   MRN:  [id number]  PCP:  Lamar Blinks, MD    Chief Complaint: Nasal Congestion   History of Present Illness:  Sarah Rangel is a 60 y.o. very pleasant female patient who presents with the following:  History of HTN. Here today with illness Just had a total hysterectomy per Dr. Quincy Simmonds.   All went well, she just returned to work about 2 weeks ago. She is getting her strength back up.   She is on benicar, hydralazine and nifedipine-  Her home BP looks fine, she brought in a list of measurements.  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  This has gotten worse - she now has thick nasal mucus, her right ear is painful, she notes a wet sounding cough No fever noted.  She has felt not 100% but was not sure if she was still just tired from her operatoin No vomiting No ST.    Hysterectomy done due to cystocele.   She is not sure if she may have been wheezing a bit  BP Readings from Last 3 Encounters:  12/29/15 180/78  12/08/15 180/80  11/11/15 184/80     Patient Active Problem List   Diagnosis Date Noted  . Status post total abdominal hysterectomy and bilateral salpingo-oophorectomy 11/02/2015  . Worsening headaches 12/05/2013  . Unspecified essential hypertension 11/06/2013  . Stress 11/06/2013    Past Medical History  Diagnosis Date  . History of cystocele   . Hypertension   . Anxiety   . History of shingles 2015    two occurances    Past Surgical History  Procedure Laterality Date  . Wisdom tooth extraction      age 80  . Abdominal hysterectomy N/A 11/02/2015    Procedure: HYSTERECTOMY ABDOMINAL;  Surgeon: Nunzio Cobbs, MD;  Location: [facility];  Service: Gynecology;  Laterality: N/A;  4 hours  . Salpingoophorectomy Bilateral 11/02/2015    Procedure: SALPINGO OOPHORECTOMY;  Surgeon: Nunzio Cobbs, MD;  Location: [facility];  Service: Gynecology;  Laterality: Bilateral;  . Abdominal sacrocolpopexy N/A 11/02/2015    Procedure: ABDOMINO SACROCOLPOPEXY with Alyte mesh and Halban's Culdoplasty;  Surgeon: Nunzio Cobbs, MD;  Location: [facility];  Service: Gynecology;  Laterality: N/A;  . Anterior and posterior repair N/A 11/02/2015    Procedure: POSTERIOR REPAIR (RECTOCELE) ;  Surgeon: Nunzio Cobbs, MD;  Location: [facility];  Service: Gynecology;  Laterality: N/A;  . Bladder suspension N/A 11/02/2015    Procedure: TRANSVAGINAL TAPE (TVT) PROCEDURE exact mid-urethral sling;  Surgeon: Nunzio Cobbs, MD;  Location: [facility];  Service: Gynecology;  Laterality: N/A;  . Cystoscopy N/A 11/02/2015    Procedure: CYSTOSCOPY;  Surgeon: Nunzio Cobbs, MD;  Location: [facility];  Service: Gynecology;  Laterality: N/A;    Social History  Substance Use Topics  . Smoking status: Former Smoker    Types: Cigarettes  . Smokeless tobacco: Never Used  . Alcohol Use: 1.2 oz/week    2 Standard drinks or equivalent per week     Comment:  occasionally 1 time monthly    Family History  Problem Relation Age of Onset  . Hypertension Father   . Colon cancer Neg Hx   . Pancreatic cancer Neg Hx   . Stomach cancer Neg Hx   . Brain cancer Mother   . Lymphoma Mother     No Known Allergies  Medication list has been reviewed and updated.  Current Outpatient Prescriptions on File Prior to Visit  Medication Sig Dispense Refill  . diphenhydrAMINE (BENADRYL) 25 mg capsule Take 25 mg by mouth every 6 (six) hours as needed for sleep.    . hydrALAZINE (APRESOLINE) 10 MG tablet Take 2 tablets (20 mg total) by mouth 3 (three) times daily. 540 tablet 3  . NIFEdipine (NIFEDICAL XL) 30 MG 24 hr  tablet Take 1 tablet (30 mg total) by mouth 2 (two) times daily. (Patient taking differently: Take 60 mg by mouth daily. ) 180 tablet 3  . olmesartan (BENICAR) 40 MG tablet Take 1 tablet (40 mg total) by mouth daily. 90 tablet 3  . LORazepam (ATIVAN) 0.5 MG tablet TAKE 1/2 TO 1 TABLET BY MOUTH TWICE DAILY AS NEEDED FOR OCCASIONAL ANXIETY (Patient not taking: Reported on 12/29/2015) 30 tablet 0   No current facility-administered medications on file prior to visit.    Review of Systems:  As per HPI- otherwise negative.   Physical Examination: Filed Vitals:   12/29/15 1409 12/29/15 1419  BP: 180/80 180/78  Pulse: 117   Temp: 98.1 F (36.7 C)   Resp: 16    Filed Vitals:   12/29/15 1409  Height: 5\' 6"  (1.676 m)  Weight: 168 lb (76.204 kg)   Body mass index is 27.13 kg/(m^2). Ideal Body Weight: Weight in (lb) to have BMI = 25: 154.6  GEN: WDWN, NAD, Non-toxic, A & O x 3, looks well HEENT: Atraumatic, Normocephalic. Neck supple. No masses, No LAD.  Bilateral TM wnl, oropharynx normal.  PEERL,EOMI.   Ears and Nose: No external deformity. CV: RRR, No M/G/R. No JVD. No thrill. No extra heart sounds. PULM: CTA B, mild wheezes, no crackles, rhonchi. No retractions. No resp. distress. No accessory muscle use.Marland Kitchen EXTR: No c/c/e NEURO Normal gait.  PSYCH: Normally interactive. Conversant. Not depressed or anxious appearing.  Calm demeanor.    Assessment and Plan: Acute frontal sinusitis, recurrence not specified  Wheezing - Plan: albuterol (PROVENTIL HFA;VENTOLIN HFA) 108 (90 Base) MCG/ACT inhaler  Acute bronchitis due to other specified organisms - Plan: doxycycline (VIBRAMYCIN) 100 MG capsule  White coat hypertension  Genasis does have HTN, but it is generally well controlled at home Continue current medications Sx of sinusitis and bronchitis Treat with doxycycline as directed Continue OTC meds as needed rx for albuterol to use if needed for cough and wheezing Let me know if not  feeling better soon- Sooner if worse.     Signed Lamar Blinks, MD

## 2015-12-29 NOTE — Progress Notes (Signed)
Pre visit review using our clinic review tool, if applicable. No additional management support is needed unless otherwise documented below in the visit note. 

## 2015-12-29 NOTE — Patient Instructions (Signed)
Continue to keep an eye on your BP at home Use the inhaler for wheezing if you wish You can continue to use OTC medications as needed We will use doxycycline for your chest and sinus infection Let me know if you do not feel better soon!

## 2016-01-20 ENCOUNTER — Encounter: Payer: Self-pay | Admitting: Obstetrics and Gynecology

## 2016-01-20 ENCOUNTER — Telehealth: Payer: Self-pay | Admitting: Obstetrics and Gynecology

## 2016-01-20 ENCOUNTER — Ambulatory Visit: Payer: 59 | Admitting: Obstetrics and Gynecology

## 2016-01-20 NOTE — Telephone Encounter (Signed)
Left message on voicemail to call and reschedule cancelled appointment from this afternoon.

## 2016-01-22 NOTE — Telephone Encounter (Signed)
Thank you for the update!

## 2016-01-23 NOTE — Telephone Encounter (Signed)
Communicated with patient through My Chart and have rescheduled appointment to Wednesday 01-26-16 per patient request.  Routing to provider for final review. Will close encounter.

## 2016-01-24 MED FILL — OLMESARTAN MEDOXOMIL 40 MG: 40 | 90 days supply | Qty: 90 | Fill #1

## 2016-01-26 ENCOUNTER — Ambulatory Visit (INDEPENDENT_AMBULATORY_CARE_PROVIDER_SITE_OTHER): Payer: 59 | Admitting: Obstetrics and Gynecology

## 2016-01-26 ENCOUNTER — Encounter: Payer: Self-pay | Admitting: Obstetrics and Gynecology

## 2016-01-26 VITALS — BP 160/80 | HR 88 | Ht 66.0 in | Wt 170.0 lb

## 2016-01-26 DIAGNOSIS — Z8742 Personal history of other diseases of the female genital tract: Secondary | ICD-10-CM

## 2016-01-26 DIAGNOSIS — Z9889 Other specified postprocedural states: Secondary | ICD-10-CM

## 2016-01-26 NOTE — Progress Notes (Signed)
Patient ID: Sarah Rangel, female   DOB: 1956/09/16, 60 y.o.   MRN: [id number] GYNECOLOGY  VISIT   HPI: 60 y.o.   Married  Caucasian  female   G2P2002 with Patient's last menstrual period was 09/18/2008 (approximate).   here for follow up visit  HYSTERECTOMY ABDOMINAL (N/A )  SALPINGO OOPHORECTOMY (Bilateral ) [id number] CPT (R)]  ABDOMINO SACROCOLPOPEXY with Alyte mesh and Halban's Culdoplasty (N/A )  POSTERIOR REPAIR (RECTOCELE) (N/A )  TRANSVAGINAL TAPE (TVT) PROCEDURE exact mid-urethral sling (N/A )CYSTOSCOPY (N/A ).   Back at work.  Feels like her stamina is almost 90% back.  Bladder and bowel function good.  No vaginal bleeding.  Some perineal soreness.  Saw Wilda Young in the last year.  Would like to see her again.    GYNECOLOGIC HISTORY: Patient's last menstrual period was 09/18/2008 (approximate). Contraception:  Vasectomy/Postmenopausal/Hysterectomy Menopausal hormone therapy:  None Last mammogram:  04-13-15 Density Cat.B/Neg/BiRads1:[facility] Last pap smear: 08-19-14 Neg:Neg HR HPV          OB History    Gravida Para Term Preterm AB TAB SAB Ectopic Multiple Living   2 2 2       2          Patient Active Problem List   Diagnosis Date Noted  . White coat hypertension 12/29/2015  . Status post total abdominal hysterectomy and bilateral salpingo-oophorectomy 11/02/2015  . Worsening headaches 12/05/2013  . Unspecified essential hypertension 11/06/2013  . Stress 11/06/2013    Past Medical History  Diagnosis Date  . History of cystocele   . Hypertension   . Anxiety   . History of shingles 2015    two occurances    Past Surgical History  Procedure Laterality Date  . Wisdom tooth extraction      age 71  . Abdominal hysterectomy N/A 11/02/2015    Procedure: HYSTERECTOMY ABDOMINAL;  Surgeon: Nunzio Cobbs, MD;  Location: [facility];  Service: Gynecology;  Laterality: N/A;  4 hours  . Salpingoophorectomy Bilateral 11/02/2015    Procedure:  SALPINGO OOPHORECTOMY;  Surgeon: Nunzio Cobbs, MD;  Location: [facility];  Service: Gynecology;  Laterality: Bilateral;  . Abdominal sacrocolpopexy N/A 11/02/2015    Procedure: ABDOMINO SACROCOLPOPEXY with Alyte mesh and Halban's Culdoplasty;  Surgeon: Nunzio Cobbs, MD;  Location: [facility];  Service: Gynecology;  Laterality: N/A;  . Anterior and posterior repair N/A 11/02/2015    Procedure: POSTERIOR REPAIR (RECTOCELE) ;  Surgeon: Nunzio Cobbs, MD;  Location: [facility];  Service: Gynecology;  Laterality: N/A;  . Bladder suspension N/A 11/02/2015    Procedure: TRANSVAGINAL TAPE (TVT) PROCEDURE exact mid-urethral sling;  Surgeon: Nunzio Cobbs, MD;  Location: [facility];  Service: Gynecology;  Laterality: N/A;  . Cystoscopy N/A 11/02/2015    Procedure: CYSTOSCOPY;  Surgeon: Nunzio Cobbs, MD;  Location: [facility];  Service: Gynecology;  Laterality: N/A;    Current Outpatient Prescriptions  Medication Sig Dispense Refill  . hydrALAZINE (APRESOLINE) 10 MG tablet Take 2 tablets (20 mg total) by mouth 3 (three) times daily. 540 tablet 3  . NIFEdipine (NIFEDICAL XL) 30 MG 24 hr tablet Take 1 tablet (30 mg total) by mouth 2 (two) times daily. (Patient taking differently: Take 60 mg by mouth daily. ) 180 tablet 3  . olmesartan (BENICAR) 40 MG tablet Take 1 tablet (40 mg total) by mouth daily. 90 tablet 3  . LORazepam (ATIVAN) 0.5 MG tablet  TAKE 1/2 TO 1 TABLET BY MOUTH TWICE DAILY AS NEEDED FOR OCCASIONAL ANXIETY (Patient not taking: Reported on 12/29/2015) 30 tablet 0   No current facility-administered medications for this visit.     ALLERGIES: Review of patient's allergies indicates no known allergies.  Family History  Problem Relation Age of Onset  . Hypertension Father   . Colon cancer Neg Hx   . Pancreatic cancer Neg Hx   . Stomach cancer Neg Hx   . Brain cancer Mother   . Lymphoma Mother     Social History   Social History  . Marital Status:  Married    Spouse Name: N/A  . Number of Children: N/A  . Years of Education: N/A   Occupational History  . Not on file.   Social History Main Topics  . Smoking status: Former Smoker    Types: Cigarettes  . Smokeless tobacco: Never Used  . Alcohol Use: 1.2 oz/week    2 Standard drinks or equivalent per week     Comment: occasionally 1 time monthly  . Drug Use: No  . Sexual Activity:    Partners: Male    Birth Control/ Protection: Other-see comments, Post-menopausal, Surgical     Comment: vasectomy/TAH/BSO   Other Topics Concern  . Not on file   Social History Narrative    ROS:  Pertinent items are noted in HPI.  PHYSICAL EXAMINATION:    BP 160/80 mmHg  Pulse 88  Ht 5\' 6"  (1.676 m)  Wt 170 lb (77.111 kg)  BMI 27.45 kg/m2  LMP 09/18/2008 (Approximate)    General appearance: alert, cooperative and appears stated age   Abdomen: incision(s):Yes.  , _______Pfannenstiel_____  soft, non-tender, no masses,  no organomegaly    Pelvic: External genitalia:  no lesions              Urethra:  normal appearing urethra with no masses, tenderness or lesions              Bartholins and Skenes: normal                 Vagina: normal appearing vagina with normal color and discharge, no lesions.  No mesh exposure or erosion.  Good vaginal length and support.              Cervix: absent           Bimanual Exam:  Uterus:  uterus absent              Adnexa: no mass, fullness, tenderness              Rectal exam: Yes.  .  Confirms.              Anus:  normal sphincter tone, no lesions  Chaperone was present for exam.  ASSESSMENT  Doing well post op.   PLAN  Return to normal activity.  Cautioned against extreme lifting, a risk for recurrence of prolapse.  Referral to [facility].  Follow up for annual exam and prn.    An After Visit Summary was printed and given to the patient.

## 2016-02-04 ENCOUNTER — Other Ambulatory Visit: Payer: Self-pay

## 2016-02-04 NOTE — Patient Outreach (Signed)
[facility]) Care Management  02/04/2016  SANIYA LAFONT 28-Dec-1955 [id number]   Phone call for high cost referral assessment.  No answer and message left. Peter Garter RN, [facility] Care Management Coordinator-Link to [place] Management [phone]

## 2016-02-07 ENCOUNTER — Other Ambulatory Visit: Payer: Self-pay

## 2016-02-07 NOTE — Patient Outreach (Signed)
[facility]) Care Management  02/07/2016  HATTYE CASTER 01-16-1956 [id number]  Phone call for high cost referral assessment.  Call #2.  No answer and message left Peter Garter RN, [facility] Care Management Coordinator-Link to [place] Management [phone]

## 2016-02-08 ENCOUNTER — Other Ambulatory Visit: Payer: Self-pay

## 2016-02-08 NOTE — Patient Outreach (Signed)
[facility]) Care Management  02/08/2016  Sarah Rangel 1956-03-23 [id number]   Phone call for high cost referral assessment.  Member had a total abdominal hysterectomy, BSO and abdominal sacrocolpopexy with mesh.   Member has hx of HTN and hyponatremia. Member states she is doing good since her surgery.  States she is going to have therapy to help strengthen her pelvic floor.  States that her B/P is elevated when she is at the doctors and it is under control when she checks it at home.  States her B/P was 128/58 this AM.  States she takes her medications without difficulty.  States she watches her diet and she lost 30 lbs to prepare for her surgery.  Denies any case management needs at this time. Instructed on [employer] and how to contact in the future is needed. Member assessed with no further interventions needed. Peter Garter RN, [facility] Care Management Coordinator-Link to [place] Management [phone]

## 2016-03-14 ENCOUNTER — Other Ambulatory Visit: Payer: Self-pay | Admitting: Family Medicine

## 2016-03-16 MED FILL — NIFEDIPINE ER 30 MG TABLET: 30 | 90 days supply | Qty: 180 | Fill #0 | Status: TO

## 2016-03-20 ENCOUNTER — Other Ambulatory Visit: Payer: Self-pay | Admitting: Obstetrics and Gynecology

## 2016-03-20 DIAGNOSIS — Z1231 Encounter for screening mammogram for malignant neoplasm of breast: Secondary | ICD-10-CM

## 2016-03-27 ENCOUNTER — Encounter: Payer: Self-pay | Admitting: Family Medicine

## 2016-04-17 ENCOUNTER — Ambulatory Visit
Admission: RE | Admit: 2016-04-17 | Discharge: 2016-04-17 | Disposition: A | Discharge status: Home / Self Care | Payer: 59 | Source: Ambulatory Visit | Attending: Obstetrics and Gynecology | Admitting: Obstetrics and Gynecology

## 2016-04-17 DIAGNOSIS — Z1231 Encounter for screening mammogram for malignant neoplasm of breast: Secondary | ICD-10-CM | POA: Diagnosis not present

## 2016-04-24 ENCOUNTER — Other Ambulatory Visit: Payer: Self-pay | Admitting: Family Medicine

## 2016-04-24 DIAGNOSIS — I10 Essential (primary) hypertension: Secondary | ICD-10-CM

## 2016-04-25 MED FILL — OLMESARTAN MEDOXOMIL 40 MG: 40 | 90 days supply | Qty: 90 | Fill #0

## 2016-05-04 ENCOUNTER — Encounter: Payer: Self-pay | Admitting: Obstetrics and Gynecology

## 2016-05-17 DIAGNOSIS — R278 Other lack of coordination: Secondary | ICD-10-CM | POA: Diagnosis not present

## 2016-05-17 DIAGNOSIS — M6281 Muscle weakness (generalized): Secondary | ICD-10-CM | POA: Diagnosis not present

## 2016-05-17 DIAGNOSIS — N812 Incomplete uterovaginal prolapse: Secondary | ICD-10-CM | POA: Diagnosis not present

## 2016-05-31 DIAGNOSIS — R278 Other lack of coordination: Secondary | ICD-10-CM | POA: Diagnosis not present

## 2016-05-31 DIAGNOSIS — M62838 Other muscle spasm: Secondary | ICD-10-CM | POA: Diagnosis not present

## 2016-05-31 DIAGNOSIS — R109 Unspecified abdominal pain: Secondary | ICD-10-CM | POA: Diagnosis not present

## 2016-05-31 DIAGNOSIS — M6281 Muscle weakness (generalized): Secondary | ICD-10-CM | POA: Diagnosis not present

## 2016-06-07 DIAGNOSIS — M62838 Other muscle spasm: Secondary | ICD-10-CM | POA: Diagnosis not present

## 2016-06-07 DIAGNOSIS — M6281 Muscle weakness (generalized): Secondary | ICD-10-CM | POA: Diagnosis not present

## 2016-06-07 DIAGNOSIS — R278 Other lack of coordination: Secondary | ICD-10-CM | POA: Diagnosis not present

## 2016-06-07 DIAGNOSIS — R109 Unspecified abdominal pain: Secondary | ICD-10-CM | POA: Diagnosis not present

## 2016-06-12 ENCOUNTER — Other Ambulatory Visit: Payer: Self-pay | Admitting: Obstetrics and Gynecology

## 2016-06-12 MED FILL — NIFEDIPINE ER 30 MG TABLET: 30 | 90 days supply | Qty: 180 | Fill #0

## 2016-06-12 MED FILL — ESTRACE 0.01% CREAM: 0.1 | 90 days supply | Qty: 43 | Fill #0

## 2016-06-12 NOTE — Telephone Encounter (Signed)
Medication refill request: Estrace  Last AEX:  09-01-15  Next AEX: 10-04-16 Last MMG (if hormonal medication request): 04-18-16 WNL Refill authorized: please advise

## 2016-06-13 DIAGNOSIS — M62838 Other muscle spasm: Secondary | ICD-10-CM | POA: Diagnosis not present

## 2016-06-13 DIAGNOSIS — R278 Other lack of coordination: Secondary | ICD-10-CM | POA: Diagnosis not present

## 2016-06-13 DIAGNOSIS — R109 Unspecified abdominal pain: Secondary | ICD-10-CM | POA: Diagnosis not present

## 2016-06-13 DIAGNOSIS — M6281 Muscle weakness (generalized): Secondary | ICD-10-CM | POA: Diagnosis not present

## 2016-06-13 DIAGNOSIS — N812 Incomplete uterovaginal prolapse: Secondary | ICD-10-CM | POA: Diagnosis not present

## 2016-06-19 DIAGNOSIS — R109 Unspecified abdominal pain: Secondary | ICD-10-CM | POA: Diagnosis not present

## 2016-06-19 DIAGNOSIS — R278 Other lack of coordination: Secondary | ICD-10-CM | POA: Diagnosis not present

## 2016-06-19 DIAGNOSIS — M62838 Other muscle spasm: Secondary | ICD-10-CM | POA: Diagnosis not present

## 2016-06-19 DIAGNOSIS — M6281 Muscle weakness (generalized): Secondary | ICD-10-CM | POA: Diagnosis not present

## 2016-07-03 ENCOUNTER — Encounter: Payer: Self-pay | Admitting: Family Medicine

## 2016-07-03 ENCOUNTER — Ambulatory Visit (INDEPENDENT_AMBULATORY_CARE_PROVIDER_SITE_OTHER): Payer: 59 | Admitting: Family Medicine

## 2016-07-03 VITALS — BP 176/74 | HR 90 | Temp 98.1°F | Ht 66.0 in | Wt 177.4 lb

## 2016-07-03 DIAGNOSIS — Z131 Encounter for screening for diabetes mellitus: Secondary | ICD-10-CM

## 2016-07-03 DIAGNOSIS — Z119 Encounter for screening for infectious and parasitic diseases, unspecified: Secondary | ICD-10-CM | POA: Diagnosis not present

## 2016-07-03 DIAGNOSIS — E871 Hypo-osmolality and hyponatremia: Secondary | ICD-10-CM | POA: Diagnosis not present

## 2016-07-03 DIAGNOSIS — Z130 Encounter for screening for diseases of the blood and blood-forming organs and certain disorders involving the immune mechanism: Secondary | ICD-10-CM

## 2016-07-03 DIAGNOSIS — L989 Disorder of the skin and subcutaneous tissue, unspecified: Secondary | ICD-10-CM

## 2016-07-03 DIAGNOSIS — F418 Other specified anxiety disorders: Secondary | ICD-10-CM

## 2016-07-03 DIAGNOSIS — Z13220 Encounter for screening for lipoid disorders: Secondary | ICD-10-CM

## 2016-07-03 DIAGNOSIS — Z1329 Encounter for screening for other suspected endocrine disorder: Secondary | ICD-10-CM

## 2016-07-03 DIAGNOSIS — I10 Essential (primary) hypertension: Secondary | ICD-10-CM | POA: Diagnosis not present

## 2016-07-03 LAB — CBC
HEMATOCRIT: 39.8 % (ref 36.0–46.0)
Hemoglobin: 13.6 g/dL (ref 12.0–15.0)
MCHC: 34.2 g/dL (ref 30.0–36.0)
MCV: 92.8 fl (ref 78.0–100.0)
Platelets: 196 10*3/uL (ref 150.0–400.0)
RBC: 4.3 Mil/uL (ref 3.87–5.11)
RDW: 13.3 % (ref 11.5–15.5)
WBC: 4 10*3/uL (ref 4.0–10.5)

## 2016-07-03 LAB — COMPREHENSIVE METABOLIC PANEL
ALT: 17 U/L (ref 0–35)
AST: 14 U/L (ref 0–37)
Albumin: 4.9 g/dL (ref 3.5–5.2)
Alkaline Phosphatase: 51 U/L (ref 39–117)
BUN: 22 mg/dL (ref 6–23)
CHLORIDE: 98 meq/L (ref 96–112)
CO2: 26 meq/L (ref 19–32)
CREATININE: 1.01 mg/dL (ref 0.40–1.20)
Calcium: 9.7 mg/dL (ref 8.4–10.5)
GFR: 58.97 mL/min — ABNORMAL LOW (ref >60.00)
Glucose, Bld: 112 mg/dL — ABNORMAL HIGH (ref 70–99)
POTASSIUM: 4.3 meq/L (ref 3.5–5.1)
SODIUM: 134 meq/L — AB (ref 135–145)
Total Bilirubin: 0.6 mg/dL (ref 0.2–1.2)
Total Protein: 7.3 g/dL (ref 6.0–8.3)

## 2016-07-03 LAB — LIPID PANEL
CHOL/HDL RATIO: 3
Cholesterol: 214 mg/dL — ABNORMAL HIGH (ref 0–200)
HDL: 70.8 mg/dL (ref >39.00)
LDL Cholesterol: 126 mg/dL — ABNORMAL HIGH (ref 0–99)
NONHDL: 143.12
TRIGLYCERIDES: 86 mg/dL (ref 0.0–149.0)
VLDL: 17.2 mg/dL (ref 0.0–40.0)

## 2016-07-03 LAB — HEMOGLOBIN A1C: Hgb A1c MFr Bld: 6 % (ref 4.6–6.5)

## 2016-07-03 LAB — TSH: TSH: 1.73 u[IU]/mL (ref 0.35–4.50)

## 2016-07-03 MED ORDER — LORAZEPAM 0.5 MG PO TABS: ORAL_TABLET | ORAL | 0 refills | Status: DC

## 2016-07-03 MED FILL — LORazepam 0.5 MG TABS: 0.5 | 15 days supply | Qty: 30 | Fill #0

## 2016-07-03 NOTE — Progress Notes (Signed)
[place] at [facility] [address] [place], [place] [address] [phone] [phone]  Date:  07/03/2016   Name:  Sarah Rangel   DOB:  1956-05-02   MRN:  [id number]  PCP:  Lamar Blinks, MD    Chief Complaint: Follow-up (Pt here for f/u visit. Would like refill on Lorazepam. c/o place on her back that she would like looked at. Already had flu vaccine. )   History of Present Illness:  Sarah Rangel is a 60 y.o. very pleasant female patient who presents with the following:  Last seen by myself 6 months ago.  She recovered fully from her hysterectomy and is back to her normal activities She does have HTN- she had been taking hydralazine 10 TID but was getting low blood pressure so she stopped this medication. Her home readings are still very good- she is on olmesartan and nifedipine still Her readings are always high at MD office but she checks her own BP at home regularly   She does use ativan on occasion- we gave her #30 in January.    She has a skin lesion on her back that has been present for a little over a year if not longer.  She is not able to see it very well and wanted me to check on it for her  She is fasting today- we will do labs for her today Flu shot already done for this year  Patient Active Problem List   Diagnosis Date Noted  . White coat hypertension 12/29/2015  . Status post total abdominal hysterectomy and bilateral salpingo-oophorectomy 11/02/2015  . Worsening headaches 12/05/2013  . Unspecified essential hypertension 11/06/2013  . Stress 11/06/2013    Past Medical History:  Diagnosis Date  . Anxiety   . History of cystocele   . History of shingles 2015   two occurances  . Hypertension     Past Surgical History:  Procedure Laterality Date  . ABDOMINAL HYSTERECTOMY N/A 11/02/2015   Procedure: HYSTERECTOMY ABDOMINAL;  Surgeon: Nunzio Cobbs, MD;  Location: [facility];  Service: Gynecology;   Laterality: N/A;  4 hours  . ABDOMINAL SACROCOLPOPEXY N/A 11/02/2015   Procedure: ABDOMINO SACROCOLPOPEXY with Alyte mesh and Halban's Culdoplasty;  Surgeon: Nunzio Cobbs, MD;  Location: [facility];  Service: Gynecology;  Laterality: N/A;  . ANTERIOR AND POSTERIOR REPAIR N/A 11/02/2015   Procedure: POSTERIOR REPAIR (RECTOCELE) ;  Surgeon: Nunzio Cobbs, MD;  Location: [facility];  Service: Gynecology;  Laterality: N/A;  . BLADDER SUSPENSION N/A 11/02/2015   Procedure: TRANSVAGINAL TAPE (TVT) PROCEDURE exact mid-urethral sling;  Surgeon: Nunzio Cobbs, MD;  Location: [facility];  Service: Gynecology;  Laterality: N/A;  . CYSTOSCOPY N/A 11/02/2015   Procedure: CYSTOSCOPY;  Surgeon: Nunzio Cobbs, MD;  Location: [facility];  Service: Gynecology;  Laterality: N/A;  . SALPINGOOPHORECTOMY Bilateral 11/02/2015   Procedure: SALPINGO OOPHORECTOMY;  Surgeon: Nunzio Cobbs, MD;  Location: [facility];  Service: Gynecology;  Laterality: Bilateral;  . WISDOM TOOTH EXTRACTION     age 60    Social History  Substance Use Topics  . Smoking status: Former Smoker    Types: Cigarettes  . Smokeless tobacco: Never Used  . Alcohol use 1.2 oz/week    2 Standard drinks or equivalent per week     Comment: occasionally 1 time monthly    Family History  Problem Relation Age  of Onset  . Hypertension Father   . Colon cancer Neg Hx   . Pancreatic cancer Neg Hx   . Stomach cancer Neg Hx   . Brain cancer Mother   . Lymphoma Mother     No Known Allergies  Medication list has been reviewed and updated.  Current Outpatient Prescriptions on File Prior to Visit  Medication Sig Dispense Refill  . ESTRACE VAGINAL 0.1 MG/GM vaginal cream USE 1/2 G VAGINALLY EVERY NIGHT FOR THE FIRST 2 WEEKS, THEN USE 1/2 G VAGINALLY TWO TIMES A WEEK. 42.5 g 0  . hydrALAZINE (APRESOLINE) 10 MG tablet Take 2 tablets (20 mg total) by mouth 3 (three) times daily. 540 tablet 3  . LORazepam (ATIVAN) 0.5 MG  tablet TAKE 1/2 TO 1 TABLET BY MOUTH TWICE DAILY AS NEEDED FOR OCCASIONAL ANXIETY (Patient not taking: Reported on 12/29/2015) 30 tablet 0  . NIFEdipine (PROCARDIA-XL/ADALAT-CC/NIFEDICAL-XL) 30 MG 24 hr tablet TAKE 1 TABLET BY MOUTH TWICE A DAY 180 tablet 3  . olmesartan (BENICAR) 40 MG tablet TAKE 1 TABLET BY MOUTH ONCE DAILY 90 tablet 3   No current facility-administered medications on file prior to visit.     Review of Systems:  As per HPI- otherwise negative.  No fever, chills, CP, SOB, nausea or vomiting   Physical Examination: Vitals:   07/03/16 1033 07/03/16 1035  BP: (!) 168/82 (!) 176/74  Pulse: 90   Temp: 98.1 F (36.7 C)    Vitals:   07/03/16 1033  Weight: 177 lb 6.4 oz (80.5 kg)  Height: 5\' 6"  (1.676 m)   Body mass index is 28.63 kg/m. Ideal Body Weight: Weight in (lb) to have BMI = 25: 154.6  GEN: WDWN, NAD, Non-toxic, A & O x 3, mild overweight, looks well HEENT: Atraumatic, Normocephalic. Neck supple. No masses, No LAD.  Bilateral TM wnl, oropharynx normal.  PEERL,EOMI.   Ears and Nose: No external deformity. CV: RRR, No M/G/R. No JVD. No thrill. No extra heart sounds. PULM: CTA B, no wheezes, crackles, rhonchi. No retractions. No resp. distress. No accessory muscle use. EXTR: No c/c/e NEURO Normal gait.  PSYCH: Normally interactive. Conversant. Not depressed or anxious appearing.  Calm demeanor.  Skin lesion on left mid back- approx 8 mm in size, slightly irregular color and irregular border.  Likely a seb keratosis but appearance is a bit unusual   Assessment and Plan: Essential hypertension  Screening for diabetes mellitus - Plan: Comprehensive metabolic panel, Hemoglobin A1C  Screening for hyperlipidemia - Plan: Lipid panel  Hyponatremia - Plan: Comprehensive metabolic panel  Skin lesion of back - Plan: Ambulatory referral to Dermatology  Screening examination for infectious disease - Plan: Hepatitis C antibody  Screening for thyroid disorder -  Plan: TSH  Screening for deficiency anemia - Plan: CBC  Situational anxiety - Plan: LORazepam (ATIVAN) 0.5 MG tablet  Pt brings in a list of recent home BP that are all <130/80. Ok to stop hydralazine Referral to derm to eval lesion on her back Refilled her ativan, labs pending as above Plan recheck in 6 months assuming labs are ok   Signed Lamar Blinks, MD

## 2016-07-03 NOTE — Patient Instructions (Signed)
It was very nice to see you today, as always!  Let me know if you start to have any trouble with your blood pressure at home We will refer you to dermatology to check out the spot on your back I will be in touch with your labs asap

## 2016-07-03 NOTE — Progress Notes (Signed)
Pre visit review using our clinic review tool, if applicable. No additional management support is needed unless otherwise documented below in the visit note. 

## 2016-07-04 ENCOUNTER — Encounter: Payer: Self-pay | Admitting: Family Medicine

## 2016-07-04 DIAGNOSIS — E119 Type 2 diabetes mellitus without complications: Secondary | ICD-10-CM | POA: Insufficient documentation

## 2016-07-04 DIAGNOSIS — R278 Other lack of coordination: Secondary | ICD-10-CM | POA: Diagnosis not present

## 2016-07-04 DIAGNOSIS — M6281 Muscle weakness (generalized): Secondary | ICD-10-CM | POA: Diagnosis not present

## 2016-07-04 DIAGNOSIS — R7303 Prediabetes: Secondary | ICD-10-CM | POA: Insufficient documentation

## 2016-07-04 DIAGNOSIS — R109 Unspecified abdominal pain: Secondary | ICD-10-CM | POA: Diagnosis not present

## 2016-07-04 DIAGNOSIS — M62838 Other muscle spasm: Secondary | ICD-10-CM | POA: Diagnosis not present

## 2016-07-04 LAB — HEPATITIS C ANTIBODY: HCV Ab: NEGATIVE

## 2016-07-10 ENCOUNTER — Telehealth: Payer: Self-pay | Admitting: Obstetrics and Gynecology

## 2016-07-10 NOTE — Telephone Encounter (Signed)
Spoke with patient. Patient had a HYSTERECTOMY ABDOMINAL, BILATERAL SALPINGO OOPHORECTOMY, ABDOMINO SACROCOLPOPEXY with Alyte mesh and Halban's Culdoplasty (N/A )         POSTERIOR REPAIR (RECTOCELE) (N/A )            TRANSVAGINAL TAPE (TVT) PROCEDURE exact mid-urethral sling (N/A )CYSTOSCOPY (N/A ) on 11/02/2015 with Dr.Silva. Reports she is experiencing increased pelvic floor pressure. Denies any pain, bleeding, or trouble with urination. Denies any pain or problems at her incision sites. "It feels like when I had a bladder prolapse before. There is a pushing on my pelvic floor." Patient has an appointment on 10/27, but is requesting an appointment earlier for evaluation due to concerns. Patient is available today. Advised I will speak with Dr.Silva and return call. Patient is agreeable. No open appointments today.

## 2016-07-10 NOTE — Telephone Encounter (Signed)
Spoke with patient. Appointment moved forward to 07/12/2016 at 9:30 am with Dr.Silva. Patient is agreeable to date and time.   Routing to provider for final review. Patient agreeable to disposition. Will close encounter.

## 2016-07-10 NOTE — Telephone Encounter (Signed)
Patient called requesting an appointment with Dr. Quincy Simmonds for today. She said, "I have having some pelvic floor discomfort that is getting worse over time. It's even worse than last week. Dr. Quincy Simmonds did surgery on me earlier this year." She scheduled an appointment with Dr. Quincy Simmonds for 07/14/16 but wants to speak with the nurse to see if she should be seen sooner than that.  Last seen: 01/26/16

## 2016-07-10 NOTE — Telephone Encounter (Signed)
Please see if patient is available to come on 10/25.

## 2016-07-12 ENCOUNTER — Ambulatory Visit (INDEPENDENT_AMBULATORY_CARE_PROVIDER_SITE_OTHER): Payer: 59 | Admitting: Obstetrics and Gynecology

## 2016-07-12 ENCOUNTER — Encounter: Payer: Self-pay | Admitting: Obstetrics and Gynecology

## 2016-07-12 VITALS — BP 178/70 | HR 88 | Resp 18 | Ht 67.0 in | Wt 179.0 lb

## 2016-07-12 DIAGNOSIS — R102 Pelvic and perineal pain: Secondary | ICD-10-CM

## 2016-07-12 DIAGNOSIS — N952 Postmenopausal atrophic vaginitis: Secondary | ICD-10-CM

## 2016-07-12 NOTE — Progress Notes (Signed)
GYNECOLOGY  VISIT   HPI: 60 y.o.   Married  Caucasian  female   G2P2002 with Patient's last menstrual period was 09/18/2008 (approximate).   here for   "pelvic floor discomfort" Symptoms occurring for the last week with standing. Has been doing great.  Voiding well.  No dysuria. BMs are fine.  No incision pain or bleeding.  Intercourse - no problems.   Has been doing PT for left hip pain which is muscular in origin. Swimming.  No real change in physical activity in the last week.   States that the quality of her life changed dramatically for the better after her prolapse surgery, and that she is happy about this.  She is worried about recurrent prolapse.  Using vaginal estrogen cream once a week.   GYNECOLOGIC HISTORY: Patient's last menstrual period was 09/18/2008 (approximate). Contraception:  Hysterectomy Menopausal hormone therapy:  Estradiol Cream Last mammogram:  04/17/16 BIRADS1 negative Last pap smear:   08-19-14 Neg:Neg HR HPV          OB History    Gravida Para Term Preterm AB Living   2 2 2     2    SAB TAB Ectopic Multiple Live Births                     Patient Active Problem List   Diagnosis Date Noted  . Pre-diabetes 07/04/2016  . White coat hypertension 12/29/2015  . Status post total abdominal hysterectomy and bilateral salpingo-oophorectomy 11/02/2015  . Worsening headaches 12/05/2013  . Unspecified essential hypertension 11/06/2013  . Stress 11/06/2013    Past Medical History:  Diagnosis Date  . Anxiety   . History of cystocele   . History of shingles 2015   two occurances  . Hypertension     Past Surgical History:  Procedure Laterality Date  . ABDOMINAL HYSTERECTOMY N/A 11/02/2015   Procedure: HYSTERECTOMY ABDOMINAL;  Surgeon: Nunzio Cobbs, MD;  Location: [facility];  Service: Gynecology;  Laterality: N/A;  4 hours  . ABDOMINAL SACROCOLPOPEXY N/A 11/02/2015   Procedure: ABDOMINO SACROCOLPOPEXY with Alyte mesh and Halban's  Culdoplasty;  Surgeon: Nunzio Cobbs, MD;  Location: [facility];  Service: Gynecology;  Laterality: N/A;  . ANTERIOR AND POSTERIOR REPAIR N/A 11/02/2015   Procedure: POSTERIOR REPAIR (RECTOCELE) ;  Surgeon: Nunzio Cobbs, MD;  Location: [facility];  Service: Gynecology;  Laterality: N/A;  . BLADDER SUSPENSION N/A 11/02/2015   Procedure: TRANSVAGINAL TAPE (TVT) PROCEDURE exact mid-urethral sling;  Surgeon: Nunzio Cobbs, MD;  Location: [facility];  Service: Gynecology;  Laterality: N/A;  . CYSTOSCOPY N/A 11/02/2015   Procedure: CYSTOSCOPY;  Surgeon: Nunzio Cobbs, MD;  Location: [facility];  Service: Gynecology;  Laterality: N/A;  . SALPINGOOPHORECTOMY Bilateral 11/02/2015   Procedure: SALPINGO OOPHORECTOMY;  Surgeon: Nunzio Cobbs, MD;  Location: [facility];  Service: Gynecology;  Laterality: Bilateral;  . WISDOM TOOTH EXTRACTION     age 89    Current Outpatient Prescriptions  Medication Sig Dispense Refill  . ESTRACE VAGINAL 0.1 MG/GM vaginal cream USE 1/2 G VAGINALLY EVERY NIGHT FOR THE FIRST 2 WEEKS, THEN USE 1/2 G VAGINALLY TWO TIMES A WEEK. 42.5 g 0  . LORazepam (ATIVAN) 0.5 MG tablet TAKE 1/2 TO 1 TABLET BY MOUTH TWICE DAILY AS NEEDED FOR OCCASIONAL ANXIETY 30 tablet 0  . NIFEdipine (PROCARDIA-XL/ADALAT-CC/NIFEDICAL-XL) 30 MG 24 hr tablet TAKE 1 TABLET BY MOUTH TWICE A DAY 180  tablet 3  . olmesartan (BENICAR) 40 MG tablet TAKE 1 TABLET BY MOUTH ONCE DAILY 90 tablet 3   No current facility-administered medications for this visit.      ALLERGIES: Review of patient's allergies indicates no known allergies.  Family History  Problem Relation Age of Onset  . Hypertension Father   . Colon cancer Neg Hx   . Pancreatic cancer Neg Hx   . Stomach cancer Neg Hx   . Brain cancer Mother   . Lymphoma Mother     Social History   Social History  . Marital status: Married    Spouse name: N/A  . Number of children: N/A  . Years of education: N/A    Occupational History  . Not on file.   Social History Main Topics  . Smoking status: Former Smoker    Types: Cigarettes  . Smokeless tobacco: Never Used  . Alcohol use 1.2 oz/week    2 Standard drinks or equivalent per week     Comment: occasionally 1 time monthly  . Drug use: No  . Sexual activity: Yes    Partners: Male    Birth control/ protection: Other-see comments, Post-menopausal, Surgical     Comment: vasectomy/TAH/BSO   Other Topics Concern  . Not on file   Social History Narrative  . No narrative on file    ROS:  Pertinent items are noted in HPI.  PHYSICAL EXAMINATION:    BP (!) 178/70 (BP Location: Right Arm, Patient Position: Sitting, Cuff Size: Normal)   Pulse 88   Resp 18   Ht 5\' 7"  (1.702 m)   Wt 179 lb (81.2 kg)   LMP 09/18/2008 (Approximate)   BMI 28.04 kg/m     General appearance: alert, cooperative and appears stated age   Abdomen: Pfannenstiel incision intact, soft, non-tender, no masses,  no organomegaly SP incisions intact.  Pelvic: External genitalia:  no lesions              Urethra:  normal appearing urethra with no masses, tenderness or lesions              Bartholins and Skenes: normal                 Vagina:  Vaginal apex with erythema anteriorly and posteriorly.  Minimal cystocele.  Excellent apical support and posterior vaginal support.              Cervix:  absent                Bimanual Exam:  Uterus:   Absent.  No palpable mesh.               Adnexa: no mass, fullness, tenderness              Rectal exam: Yes.  .  Confirms.              Anus:  normal sphincter tone, no lesions  Chaperone was present for exam.  ASSESSMENT  Pelvic pressure. Status post TAH/BSO/Sacrocolpopexy/halban's culdoplasty/TVT Exact midurethral sling/cystoscopy/posterior colporrhaphy.  Minimal cystocele. Overall I am pleased with her anatomic outcome from her surgery.   PLAN  Discussion of surgical care and findings today of minimal cystocele and  vaginal atrophy.  3D model used.   Extensive discussion of anatomy and surgical procedure.  Discussion of potential recurrence risk of prolapse.   She understands that she can have surgery if prolapse recurs but I told her I would like to have really clear  goals if we would consider doing this.  She currently has a functional vagina for intercourse, has good bladder control and function, and good bowel function.  We discussed repeat surgery could tighten the vagina.  Use vaginal estrogen cream 1/2 gram pv with applicator twice weekly.  I emphasized the importance of this.  Follow up around 11/01/16 and prn.  Patient states appreciation for her care.    An After Visit Summary was printed and given to the patient.  ___40___ minutes face to face time of which over 50% was spent in counseling.

## 2016-07-14 ENCOUNTER — Ambulatory Visit: Payer: 59 | Admitting: Obstetrics and Gynecology

## 2016-07-14 DIAGNOSIS — D225 Melanocytic nevi of trunk: Secondary | ICD-10-CM | POA: Diagnosis not present

## 2016-07-14 DIAGNOSIS — L821 Other seborrheic keratosis: Secondary | ICD-10-CM | POA: Diagnosis not present

## 2016-07-17 ENCOUNTER — Other Ambulatory Visit: Payer: Self-pay | Admitting: Obstetrics and Gynecology

## 2016-07-17 NOTE — Telephone Encounter (Signed)
Medication refill request: Estrace Cream  Last AEX:  09-01-15  Next AEX: 11-01-16  Last MMG (if hormonal medication request): 04-18-16 WNL  Refill authorized: please advise

## 2016-07-20 MED FILL — HYDROCORT-PRAMOXINE 2.5-1%: 2.5-1 | 7 days supply | Qty: 30 | Fill #0

## 2016-07-20 MED FILL — OLMESARTAN MEDOXOMIL 40 MG: 40 | 90 days supply | Qty: 90 | Fill #1

## 2016-07-24 ENCOUNTER — Encounter: Payer: Self-pay | Admitting: Family Medicine

## 2016-07-24 MED ORDER — NEOMYCIN-POLYMYXIN-HC 1 % OT SOLN: OTIC | 0 refills | Status: DC

## 2016-07-24 MED FILL — NEO/POLYMYXIN/HC EAR SOLN: 3.5-10000-1 | 12 days supply | Qty: 10 | Fill #0

## 2016-07-26 ENCOUNTER — Ambulatory Visit: Payer: 59 | Admitting: Obstetrics and Gynecology

## 2016-09-04 MED FILL — ESTRACE 0.01% CREAM: 0.1 | 90 days supply | Qty: 43 | Fill #0

## 2016-09-04 MED FILL — NIFEDIPINE ER 30 MG TABLET: 30 | 90 days supply | Qty: 180 | Fill #1

## 2016-10-04 ENCOUNTER — Ambulatory Visit: Payer: 59 | Admitting: Obstetrics and Gynecology

## 2016-10-16 MED FILL — OLMESARTAN MEDOXOMIL 40 MG: 40 | 90 days supply | Qty: 90 | Fill #2

## 2016-11-01 ENCOUNTER — Encounter: Payer: Self-pay | Admitting: Obstetrics and Gynecology

## 2016-11-01 ENCOUNTER — Ambulatory Visit (INDEPENDENT_AMBULATORY_CARE_PROVIDER_SITE_OTHER): Payer: 59 | Admitting: Obstetrics and Gynecology

## 2016-11-01 VITALS — BP 148/84 | HR 70 | Resp 12 | Ht 66.0 in | Wt 180.0 lb

## 2016-11-01 DIAGNOSIS — Z01419 Encounter for gynecological examination (general) (routine) without abnormal findings: Secondary | ICD-10-CM | POA: Diagnosis not present

## 2016-11-01 NOTE — Patient Instructions (Signed)

## 2016-11-01 NOTE — Progress Notes (Signed)
61 y.o. [id number] Married Caucasian female here for annual exam.    Good bladder and bowel function.  Voiding well.  No incontinence.  No bulge. Using colace daily.  Doing some pelvic floor therapy on her own.  Has seen [name] at [facility] in past.  Using vaginal Estrace cream once a week. Does not need refill.   Doing spin class and swimming.  Can feel some pelvic floor pressure with exercise. Not running.  Still hesitant to do this level of activity.  Sexually active. No female or female dyspareunia.  PCP:  Silvestre Mesi, MD  Patient's last menstrual period was 09/18/2008 (approximate).           Sexually active: Yes.   female The current method of family planning is vasectomy and status post hysterectomy/bil.salpingo-oophorectomy 10/2015.    Exercising: Yes.    Swimming, spin class and walking Smoker:  no  Health Maintenance: Pap:  08-19-14 Neg:Neg HR HPV History of abnormal Pap:  Patient has a  Hx of Ascus paps but HPV neg. MMG:  04-17-16 Density B/Neg/BiRads1:TBC Colonoscopy:  10-30-13 normal with Dr. Monna Fam 2025.  BMD: 06-29-10  Result:Normal at [facility] T[facility]P:  03-16-15 Gardasil:   N/A HIV:  NR 05/18/14. Hep C:  Neg 07/03/16. Screening Labs:  Hb today: PCP, Urine today: unable to void   reports that she has quit smoking. Her smoking use included Cigarettes. She has never used smokeless tobacco. She reports that she drinks about 1.2 oz of alcohol per week . She reports that she does not use drugs.  Past Medical History:  Diagnosis Date  . Anxiety   . History of cystocele   . History of shingles 2015   two occurances  . Hypertension     Past Surgical History:  Procedure Laterality Date  . ABDOMINAL HYSTERECTOMY N/A 11/02/2015   Procedure: HYSTERECTOMY ABDOMINAL;  Surgeon: Nunzio Cobbs, MD;  Location: [facility];  Service: Gynecology;  Laterality: N/A;  4 hours  . ABDOMINAL SACROCOLPOPEXY N/A 11/02/2015   Procedure: ABDOMINO  SACROCOLPOPEXY with Alyte mesh and Halban's Culdoplasty;  Surgeon: Nunzio Cobbs, MD;  Location: [facility];  Service: Gynecology;  Laterality: N/A;  . ANTERIOR AND POSTERIOR REPAIR N/A 11/02/2015   Procedure: POSTERIOR REPAIR (RECTOCELE) ;  Surgeon: Nunzio Cobbs, MD;  Location: [facility];  Service: Gynecology;  Laterality: N/A;  . BLADDER SUSPENSION N/A 11/02/2015   Procedure: TRANSVAGINAL TAPE (TVT) PROCEDURE exact mid-urethral sling;  Surgeon: Nunzio Cobbs, MD;  Location: [facility];  Service: Gynecology;  Laterality: N/A;  . CYSTOSCOPY N/A 11/02/2015   Procedure: CYSTOSCOPY;  Surgeon: Nunzio Cobbs, MD;  Location: [facility];  Service: Gynecology;  Laterality: N/A;  . SALPINGOOPHORECTOMY Bilateral 11/02/2015   Procedure: SALPINGO OOPHORECTOMY;  Surgeon: Nunzio Cobbs, MD;  Location: [facility];  Service: Gynecology;  Laterality: Bilateral;  . WISDOM TOOTH EXTRACTION     age 19    Current Outpatient Prescriptions  Medication Sig Dispense Refill  . ESTRACE VAGINAL 0.1 MG/GM vaginal cream USE 1/2 G VAGINALLY EVERY NIGHT FOR THE FIRST 2 WEEKS, THEN USE 1/2 G VAGINALLY TWO TIMES A WEEK. 42.5 g 1  . LORazepam (ATIVAN) 0.5 MG tablet TAKE 1/2 TO 1 TABLET BY MOUTH TWICE DAILY AS NEEDED FOR OCCASIONAL ANXIETY 30 tablet 0  . NIFEdipine (PROCARDIA-XL/ADALAT-CC/NIFEDICAL-XL) 30 MG 24 hr tablet TAKE 1 TABLET BY MOUTH TWICE A DAY 180 tablet 3  . olmesartan (  BENICAR) 40 MG tablet TAKE 1 TABLET BY MOUTH ONCE DAILY 90 tablet 3   No current facility-administered medications for this visit.     Family History  Problem Relation Age of Onset  . Hypertension Father   . Brain cancer Mother   . Lymphoma Mother   . Colon cancer Neg Hx   . Pancreatic cancer Neg Hx   . Stomach cancer Neg Hx     ROS:  Pertinent items are noted in HPI.  Otherwise, a comprehensive ROS was negative.  Exam:   BP (!) 148/84 (BP Location: Right Arm, Patient Position: Sitting, Cuff Size: Normal)    Pulse 70   Resp 12   Ht 5\' 6"  (1.676 m)   Wt 180 lb (81.6 kg)   LMP 09/18/2008 (Approximate)   BMI 29.05 kg/m     General appearance: alert, cooperative and appears stated age Head: Normocephalic, without obvious abnormality, atraumatic Neck: no adenopathy, supple, symmetrical, trachea midline and thyroid normal to inspection and palpation Lungs: clear to auscultation bilaterally Breasts: normal appearance, no masses or tenderness, No nipple retraction or dimpling, No nipple discharge or bleeding, No axillary or supraclavicular adenopathy Heart: regular rate and rhythm Abdomen: soft, non-tender; no masses, no organomegaly Extremities: extremities normal, atraumatic, no cyanosis or edema Skin: Skin color, texture, turgor normal. No rashes or lesions Lymph nodes: Cervical, supraclavicular, and axillary nodes normal. No abnormal inguinal nodes palpated Neurologic: Grossly normal  Pelvic: External genitalia:  no lesions              Urethra:  normal appearing urethra with no masses, tenderness or lesions              Bartholins and Skenes: normal                 Vagina: normal appearing vagina with normal color and discharge, no lesions.  Minimal midbladder prolapse.              Cervix:  absent              Pap taken:  no Bimanual Exam:  Uterus:   absent              Adnexa: no mass, fullness, tenderness              Rectal exam: Yes.  .  Confirms.              Anus:  normal sphincter tone, no lesions  Chaperone was present for exam.  Assessment:   Well woman visit with normal exam. Status post TAH/BSO/Sacrocolpopexy/post repair/TVT/cysto.  Doing well overall.   Plan: Mammogram screening discussed. Recommended self breast awareness. Pap and HR HPV as above. Guidelines for Calcium, Vitamin D, regular exercise program including cardiovascular and weight bearing exercise. We reviewed a 3D pelvic model and her anatomic outcome from surgery.  I encouraged her to resume all of  her activities including running if she wishes to do this.  Return if develops any recurrent prolapse or functional pelvic floor issues.  Continue Estrace 1/2 gram pv at hs once weekly.  She does not need refills.  She knows that it is important for her to continue with her mammograms to insure good breast health for the use of this cream. BMD in the next year or two. Follow up annually and prn.       After visit summary provided.

## 2016-12-04 MED FILL — NIFEDIPINE ER 30 MG TABLET: 30 | 90 days supply | Qty: 180 | Fill #2

## 2016-12-06 DIAGNOSIS — H25813 Combined forms of age-related cataract, bilateral: Secondary | ICD-10-CM | POA: Diagnosis not present

## 2016-12-06 DIAGNOSIS — H52223 Regular astigmatism, bilateral: Secondary | ICD-10-CM | POA: Diagnosis not present

## 2016-12-06 DIAGNOSIS — H524 Presbyopia: Secondary | ICD-10-CM | POA: Diagnosis not present

## 2016-12-06 DIAGNOSIS — H5203 Hypermetropia, bilateral: Secondary | ICD-10-CM | POA: Diagnosis not present

## 2017-01-10 MED FILL — OLMESARTAN MEDOXOMIL 40 MG: 40 | 90 days supply | Qty: 90 | Fill #3

## 2017-03-06 ENCOUNTER — Other Ambulatory Visit: Payer: Self-pay | Admitting: Obstetrics and Gynecology

## 2017-03-06 DIAGNOSIS — Z1231 Encounter for screening mammogram for malignant neoplasm of breast: Secondary | ICD-10-CM

## 2017-03-07 ENCOUNTER — Encounter: Payer: Self-pay | Admitting: Family Medicine

## 2017-03-08 ENCOUNTER — Other Ambulatory Visit: Payer: Self-pay | Admitting: Family Medicine

## 2017-03-08 MED FILL — NIFEDIPINE ER 30 MG TABLET: 30 | 90 days supply | Qty: 180 | Fill #0 | Status: TO

## 2017-04-17 ENCOUNTER — Other Ambulatory Visit: Payer: Self-pay | Admitting: Family Medicine

## 2017-04-17 DIAGNOSIS — I10 Essential (primary) hypertension: Secondary | ICD-10-CM

## 2017-04-18 ENCOUNTER — Other Ambulatory Visit: Payer: Self-pay | Admitting: Emergency Medicine

## 2017-04-18 DIAGNOSIS — I10 Essential (primary) hypertension: Secondary | ICD-10-CM

## 2017-04-18 MED ORDER — OLMESARTAN MEDOXOMIL 40 MG PO TABS: 40.0000 mg | ORAL_TABLET | Freq: Every day | ORAL | 0 refills | Status: DC

## 2017-04-22 NOTE — Progress Notes (Addendum)
[place] at [facility] [address], [place], [place] [address] [phone]  Date:  04/23/2017   Name:  Sarah Rangel   DOB:  1956/02/20   MRN:  [id number]  PCP:  Darreld Mclean, MD    Chief Complaint: Follow-up (Pt here for med f/u visit on olmesartan and NIFEdipine. Pt would like to discuss LORazepam. )   History of Present Illness:  Sarah Rangel is a 61 y.o. very pleasant female patient who presents with the following:  Here today for a follow-up visit Last seen here in October:  Last seen by myself 6 months ago.  She recovered fully from her hysterectomy and is back to her normal activities She does have HTN- she had been taking hydralazine 10 TID but was getting low blood pressure so she stopped this medication. Her home readings are still very good- she is on olmesartan and nifedipine still Her readings are always high at MD office but she checks her own BP at home regularly   She does use ativan on occasion- we gave her #30 in January.    She saw Dr. Quincy Simmonds for her well- woman exam in February.  We stopped her hydralazine last year.  She has had an OK summer- there has been some stress in her family We have used some occasional ativan for her, but she feels like she is a bit more depressed lately She exercises, tried to do "my own CBT" with positive thinking, etc She uses ativan on rare occasion.  This does help "for a short time." she does not wish to use this on a regular basis.   She has not had any significant trouble with depression in the past, but does feel that this is currently an issue and she is interested in treatment She notes lack of interest and excitement about things She is still able to fulfill all of her responsibilities at home and work She has felt depressed for about 2 months now.  Using melatonin at night for sleep which does help appetite is ok- sometimes ok, sometimes she does not feel  hungry Crying spells are not a concern for her No SI  She did contact me about a stye in her right eye about a month ago- her lower lid is still a bit red.  At home her BP is still ok- this am she got 140/ 68.  She has some other readings recently- 136/64, 136/67  She is on benicar and procardia for her BP She will take hydralazine on occasion if her BP is high  She did have a thyroid level in October  She is fasting today for labs   Patient Active Problem List   Diagnosis Date Noted  . Pre-diabetes 07/04/2016  . White coat hypertension 12/29/2015  . Status post total abdominal hysterectomy and bilateral salpingo-oophorectomy 11/02/2015  . Worsening headaches 12/05/2013  . Unspecified essential hypertension 11/06/2013  . Stress 11/06/2013    Past Medical History:  Diagnosis Date  . Anxiety   . History of cystocele   . History of shingles 2015   two occurances  . Hypertension     Past Surgical History:  Procedure Laterality Date  . ABDOMINAL HYSTERECTOMY N/A 11/02/2015   Procedure: HYSTERECTOMY ABDOMINAL;  Surgeon: Nunzio Cobbs, MD;  Location: [facility];  Service: Gynecology;  Laterality: N/A;  4 hours  . ABDOMINAL SACROCOLPOPEXY N/A 11/02/2015   Procedure: ABDOMINO SACROCOLPOPEXY with  Alyte mesh and Halban's Culdoplasty;  Surgeon: Nunzio Cobbs, MD;  Location: [facility];  Service: Gynecology;  Laterality: N/A;  . ANTERIOR AND POSTERIOR REPAIR N/A 11/02/2015   Procedure: POSTERIOR REPAIR (RECTOCELE) ;  Surgeon: Nunzio Cobbs, MD;  Location: [facility];  Service: Gynecology;  Laterality: N/A;  . BLADDER SUSPENSION N/A 11/02/2015   Procedure: TRANSVAGINAL TAPE (TVT) PROCEDURE exact mid-urethral sling;  Surgeon: Nunzio Cobbs, MD;  Location: [facility];  Service: Gynecology;  Laterality: N/A;  . CYSTOSCOPY N/A 11/02/2015   Procedure: CYSTOSCOPY;  Surgeon: Nunzio Cobbs, MD;  Location: [facility];  Service: Gynecology;  Laterality: N/A;  .  SALPINGOOPHORECTOMY Bilateral 11/02/2015   Procedure: SALPINGO OOPHORECTOMY;  Surgeon: Nunzio Cobbs, MD;  Location: [facility];  Service: Gynecology;  Laterality: Bilateral;  . WISDOM TOOTH EXTRACTION     age 66    Social History  Substance Use Topics  . Smoking status: Former Smoker    Types: Cigarettes  . Smokeless tobacco: Never Used  . Alcohol use 1.2 oz/week    2 Standard drinks or equivalent per week     Comment: occasionally 1 time monthly    Family History  Problem Relation Age of Onset  . Hypertension Father   . Brain cancer Mother   . Lymphoma Mother   . Colon cancer Neg Hx   . Pancreatic cancer Neg Hx   . Stomach cancer Neg Hx     No Known Allergies  Medication list has been reviewed and updated.  Current Outpatient Prescriptions on File Prior to Visit  Medication Sig Dispense Refill  . ESTRACE VAGINAL 0.1 MG/GM vaginal cream USE 1/2 G VAGINALLY EVERY NIGHT FOR THE FIRST 2 WEEKS, THEN USE 1/2 G VAGINALLY TWO TIMES A WEEK. 42.5 g 1  . NIFEdipine (PROCARDIA-XL/ADALAT-CC/NIFEDICAL-XL) 30 MG 24 hr tablet TAKE 1 TABLET BY MOUTH TWICE DAILY (Patient taking differently: TAKE 2 TABLETS BY MOUTH  DAILY) 180 tablet 2   No current facility-administered medications on file prior to visit.     Review of Systems:  As per HPI- otherwise negative. No fever or chills No CP or SOB   Physical Examination: Vitals:   04/23/17 1010 04/23/17 1036  BP: (!) 180/90 (!) 172/82  Pulse: 86   Temp: 98.1 F (36.7 C)    Vitals:   04/23/17 1010  Weight: 177 lb 3.2 oz (80.4 kg)  Height: 5\' 6"  (1.676 m)   Body mass index is 28.6 kg/m. Ideal Body Weight: Weight in (lb) to have BMI = 25: 154.6  GEN: WDWN, NAD, Non-toxic, A & O x 3, mild overweight, looks well HEENT: Atraumatic, Normocephalic. Neck supple. No masses, No LAD.  Bilateral TM wnl, oropharynx normal.  PEERL,EOMI.   Right lower lid shows a small area of persistent inflammation and irritation Ears and Nose: No  external deformity. CV: RRR, No M/G/R. No JVD. No thrill. No extra heart sounds. PULM: CTA B, no wheezes, crackles, rhonchi. No retractions. No resp. distress. No accessory muscle use. EXTR: No c/c/e NEURO Normal gait.  PSYCH: Normally interactive. Conversant. Not depressed or anxious appearing.  Calm demeanor.    Assessment and Plan: Major depressive disorder with single episode, remission status unspecified - Plan: escitalopram (LEXAPRO) 10 MG tablet  Essential hypertension - Plan: olmesartan (BENICAR) 40 MG tablet  Situational anxiety - Plan: LORazepam (ATIVAN) 0.5 MG tablet  Hordeolum externum of right lower eyelid  It was a pleasure to see  you today!  Take care and please let me know how the lexapro is working for you Take it once a day- after 2-3 weeks we should start seeing an effect You can also use the ativan as needed for anxiety Let me know if any concerns or if you are feeling worse Otherwise please see me in 6-8 weeks for a recheck If the lesion on your right lower lid is not gone in the next month please see Dr. Sabra Heck   Signed Lamar Blinks, MD  Received her labs as below 8/7  Results for orders placed or performed in visit on 04/23/17  CBC  Result Value Ref Range   WBC 4.5 4.0 - 10.5 K/uL   RBC 4.21 3.87 - 5.11 Mil/uL   Platelets 200.0 150.0 - 400.0 K/uL   Hemoglobin 13.6 12.0 - 15.0 g/dL   HCT 40.9 36.0 - 46.0 %   MCV 97.3 78.0 - 100.0 fl   MCHC 33.3 30.0 - 36.0 g/dL   RDW 12.7 11.5 - 15.5 %  Comprehensive metabolic panel  Result Value Ref Range   Sodium 133 (L) 135 - 145 mEq/L   Potassium 3.8 3.5 - 5.1 mEq/L   Chloride 97 96 - 112 mEq/L   CO2 27 19 - 32 mEq/L   Glucose, Bld 132 (H) 70 - 99 mg/dL   BUN 16 6 - 23 mg/dL   Creatinine, Ser 0.93 0.40 - 1.20 mg/dL   Total Bilirubin 0.6 0.2 - 1.2 mg/dL   Alkaline Phosphatase 43 39 - 117 U/L   AST 12 0 - 37 U/L   ALT 19 0 - 35 U/L   Total Protein 7.1 6.0 - 8.3 g/dL   Albumin 4.7 3.5 - 5.2 g/dL    Calcium 9.3 8.4 - 10.5 mg/dL   GFR 64.69 >60.00 mL/min  Hemoglobin A1c  Result Value Ref Range   Hgb A1c MFr Bld 6.4 4.6 - 6.5 %  Lipid panel  Result Value Ref Range   Cholesterol 168 0 - 200 mg/dL   Triglycerides 74.0 0.0 - 149.0 mg/dL   HDL 73.00 >39.00 mg/dL   VLDL 14.8 0.0 - 40.0 mg/dL   LDL Cholesterol 81 0 - 99 mg/dL   Total CHOL/HDL Ratio 2    NonHDL 95.47    Message to pt  Wt Readings from Last 3 Encounters:  04/23/17 177 lb 3.2 oz (80.4 kg)  11/01/16 180 lb (81.6 kg)  07/12/16 179 lb (81.2 kg)

## 2017-04-23 ENCOUNTER — Ambulatory Visit: Payer: 59 | Admitting: Family Medicine

## 2017-04-23 ENCOUNTER — Ambulatory Visit (INDEPENDENT_AMBULATORY_CARE_PROVIDER_SITE_OTHER): Payer: 59 | Admitting: Family Medicine

## 2017-04-23 ENCOUNTER — Ambulatory Visit
Admission: RE | Admit: 2017-04-23 | Discharge: 2017-04-23 | Disposition: A | Discharge status: Home / Self Care | Payer: 59 | Source: Ambulatory Visit | Attending: Obstetrics and Gynecology | Admitting: Obstetrics and Gynecology

## 2017-04-23 VITALS — BP 172/82 | HR 86 | Temp 98.1°F | Ht 66.0 in | Wt 177.2 lb

## 2017-04-23 DIAGNOSIS — Z13220 Encounter for screening for lipoid disorders: Secondary | ICD-10-CM | POA: Diagnosis not present

## 2017-04-23 DIAGNOSIS — I10 Essential (primary) hypertension: Secondary | ICD-10-CM

## 2017-04-23 DIAGNOSIS — F418 Other specified anxiety disorders: Secondary | ICD-10-CM

## 2017-04-23 DIAGNOSIS — H00012 Hordeolum externum right lower eyelid: Secondary | ICD-10-CM

## 2017-04-23 DIAGNOSIS — F329 Major depressive disorder, single episode, unspecified: Secondary | ICD-10-CM

## 2017-04-23 DIAGNOSIS — Z130 Encounter for screening for diseases of the blood and blood-forming organs and certain disorders involving the immune mechanism: Secondary | ICD-10-CM

## 2017-04-23 DIAGNOSIS — R7303 Prediabetes: Secondary | ICD-10-CM

## 2017-04-23 DIAGNOSIS — Z1231 Encounter for screening mammogram for malignant neoplasm of breast: Secondary | ICD-10-CM

## 2017-04-23 LAB — COMPREHENSIVE METABOLIC PANEL
ALK PHOS: 43 U/L (ref 39–117)
ALT: 19 U/L (ref 0–35)
AST: 12 U/L (ref 0–37)
Albumin: 4.7 g/dL (ref 3.5–5.2)
BUN: 16 mg/dL (ref 6–23)
CO2: 27 meq/L (ref 19–32)
Calcium: 9.3 mg/dL (ref 8.4–10.5)
Chloride: 97 mEq/L (ref 96–112)
Creatinine, Ser: 0.93 mg/dL (ref 0.40–1.20)
GFR: 64.69 mL/min (ref >60.00)
GLUCOSE: 132 mg/dL — AB (ref 70–99)
POTASSIUM: 3.8 meq/L (ref 3.5–5.1)
Sodium: 133 mEq/L — ABNORMAL LOW (ref 135–145)
TOTAL PROTEIN: 7.1 g/dL (ref 6.0–8.3)
Total Bilirubin: 0.6 mg/dL (ref 0.2–1.2)

## 2017-04-23 LAB — CBC
HEMATOCRIT: 40.9 % (ref 36.0–46.0)
HEMOGLOBIN: 13.6 g/dL (ref 12.0–15.0)
MCHC: 33.3 g/dL (ref 30.0–36.0)
MCV: 97.3 fl (ref 78.0–100.0)
Platelets: 200 10*3/uL (ref 150.0–400.0)
RBC: 4.21 Mil/uL (ref 3.87–5.11)
RDW: 12.7 % (ref 11.5–15.5)
WBC: 4.5 10*3/uL (ref 4.0–10.5)

## 2017-04-23 LAB — LIPID PANEL
Cholesterol: 168 mg/dL (ref 0–200)
HDL: 73 mg/dL (ref >39.00)
LDL Cholesterol: 81 mg/dL (ref 0–99)
NonHDL: 95.47
TRIGLYCERIDES: 74 mg/dL (ref 0.0–149.0)
Total CHOL/HDL Ratio: 2
VLDL: 14.8 mg/dL (ref 0.0–40.0)

## 2017-04-23 LAB — HEMOGLOBIN A1C: Hgb A1c MFr Bld: 6.4 % (ref 4.6–6.5)

## 2017-04-23 MED ORDER — LORAZEPAM 0.5 MG PO TABS: ORAL_TABLET | ORAL | 0 refills | Status: DC

## 2017-04-23 MED ORDER — OLMESARTAN MEDOXOMIL 40 MG PO TABS: 40.0000 mg | ORAL_TABLET | Freq: Every day | ORAL | 3 refills | Status: DC

## 2017-04-23 MED ORDER — ESCITALOPRAM OXALATE 10 MG PO TABS: 10.0000 mg | ORAL_TABLET | Freq: Every day | ORAL | 3 refills | Status: DC

## 2017-04-23 MED FILL — ESCITALOPRAM 10 MG TABLET: 10 | 90 days supply | Qty: 90 | Fill #0 | Status: TO

## 2017-04-23 MED FILL — LORazepam 0.5 MG TABS: 0.5 | 15 days supply | Qty: 30 | Fill #0

## 2017-04-23 MED FILL — OLMESARTAN MEDOXOMIL 40 MG: 40 | 90 days supply | Qty: 90 | Fill #0 | Status: TO

## 2017-04-23 NOTE — Patient Instructions (Signed)
It was a pleasure to see you today!  Take care and please let me know how the lexapro is working for you Take it once a day- after 2-3 weeks we should start seeing an effect You can also use the ativan as needed for anxiety Let me know if any concerns or if you are feeling worse Otherwise please see me in 6-8 weeks for a recheck If the lesion on your right lower lid is not gone in the next month please see Dr. Sabra Heck

## 2017-04-24 ENCOUNTER — Encounter: Payer: Self-pay | Admitting: Family Medicine

## 2017-06-11 MED FILL — NIFEDIPINE ER 30 MG TABLET: 30 | 90 days supply | Qty: 180 | Fill #0

## 2017-07-17 MED FILL — OLMESARTAN MEDOXOMIL 40 MG: 40 | 90 days supply | Qty: 90 | Fill #0

## 2017-07-31 MED FILL — ESCITALOPRAM 10 MG TABLET: 10 | 90 days supply | Qty: 90 | Fill #0

## 2017-09-10 MED FILL — NIFEDIPINE ER 30 MG TABLET: 30 | 90 days supply | Qty: 180 | Fill #1

## 2017-10-15 MED FILL — OLMESARTAN MEDOXOMIL 40 MG: 40 | 90 days supply | Qty: 90 | Fill #1

## 2017-11-07 ENCOUNTER — Ambulatory Visit: Payer: 59 | Admitting: Obstetrics and Gynecology

## 2017-11-28 ENCOUNTER — Encounter: Payer: 59 | Admitting: Family Medicine

## 2017-12-07 ENCOUNTER — Other Ambulatory Visit: Payer: Self-pay | Admitting: Family Medicine

## 2017-12-07 MED FILL — NIFEDIPINE ER 30 MG TABLET: 30 | 90 days supply | Qty: 180 | Fill #0

## 2017-12-26 DIAGNOSIS — H5203 Hypermetropia, bilateral: Secondary | ICD-10-CM | POA: Diagnosis not present

## 2017-12-26 DIAGNOSIS — H52222 Regular astigmatism, left eye: Secondary | ICD-10-CM | POA: Diagnosis not present

## 2017-12-26 DIAGNOSIS — H524 Presbyopia: Secondary | ICD-10-CM | POA: Diagnosis not present

## 2017-12-26 DIAGNOSIS — H25813 Combined forms of age-related cataract, bilateral: Secondary | ICD-10-CM | POA: Diagnosis not present

## 2018-01-01 NOTE — Progress Notes (Addendum)
[place] at [facility] [address] [place], [place] [address] [phone]  Date:  01/02/2018   Name:  Sarah Rangel   DOB:  03-Apr-1956   MRN:  [id number]  PCP:  Darreld Mclean, MD    Chief Complaint: Annual Exam (Pt here for CPE. )   History of Present Illness:  Sarah Rangel is a 62 y.o. very pleasant female patient who presents with the following:  Here today for a CPE History of HTN, pre-diabetes  I last saw her in August of last year:  She saw Dr. Quincy Simmonds for her well- woman exam in February.  We stopped her hydralazine last year.  She has had an OK summer- there has been some stress in her family We have used some occasional ativan for her, but she feels like she is a bit more depressed lately She exercises, tried to do "my own CBT" with positive thinking, etc She uses ativan on rare occasion.  This does help "for a short time." she does not wish to use this on a regular basis.   She has not had any significant trouble with depression in the past, but does feel that this is currently an issue and she is interested in treatment She notes lack of interest and excitement about things She is still able to fulfill all of her responsibilities at home and work She has felt depressed for about 2 months now.  Using melatonin at night for sleep which does help appetite is ok- sometimes ok, sometimes she does not feel hungry Crying spells are not a concern for her No SI She is on benicar and procardia for her BP She will take hydralazine on occasion if her BP is high  She did have a thyroid level in October  Her sleep is better, she is not having to take her lorazepam any longer.  She is also off lexapro   She is just on her BP med She sees Dr Quincy Simmonds S/p hysterectomy and oophorectomy  Mammo: due this august  Colon: 2015, she got a 10 year follow-up   She has fasted for about 6 hours so we will do labs today   She  is on nifedipine and also benicar for her BP and tolerates this well  BP 138/76 last week at eye doctor Her home BP is generally 130s/70s at home - she does check it on a regular basis    She swims laps a couple of times a week and also likes to walk for exercise- no CP or SOB with exercise  BP Readings from Last 3 Encounters:  01/02/18 (!) 164/82  04/23/17 (!) 172/82  11/01/16 (!) 148/84   Wt Readings from Last 3 Encounters:  01/02/18 185 lb 12.8 oz (84.3 kg)  04/23/17 177 lb 3.2 oz (80.4 kg)  11/01/16 180 lb (81.6 kg)    Patient Active Problem List   Diagnosis Date Noted  . Pre-diabetes 07/04/2016  . White coat hypertension 12/29/2015  . Status post total abdominal hysterectomy and bilateral salpingo-oophorectomy 11/02/2015  . Worsening headaches 12/05/2013  . Unspecified essential hypertension 11/06/2013  . Stress 11/06/2013    Past Medical History:  Diagnosis Date  . Anxiety   . History of cystocele   . History of shingles 2015   two occurances  . Hypertension     Past Surgical History:  Procedure Laterality Date  . ABDOMINAL HYSTERECTOMY N/A 11/02/2015   Procedure: HYSTERECTOMY  ABDOMINAL;  Surgeon: Nunzio Cobbs, MD;  Location: [facility];  Service: Gynecology;  Laterality: N/A;  4 hours  . ABDOMINAL SACROCOLPOPEXY N/A 11/02/2015   Procedure: ABDOMINO SACROCOLPOPEXY with Alyte mesh and Halban's Culdoplasty;  Surgeon: Nunzio Cobbs, MD;  Location: [facility];  Service: Gynecology;  Laterality: N/A;  . ANTERIOR AND POSTERIOR REPAIR N/A 11/02/2015   Procedure: POSTERIOR REPAIR (RECTOCELE) ;  Surgeon: Nunzio Cobbs, MD;  Location: [facility];  Service: Gynecology;  Laterality: N/A;  . BLADDER SUSPENSION N/A 11/02/2015   Procedure: TRANSVAGINAL TAPE (TVT) PROCEDURE exact mid-urethral sling;  Surgeon: Nunzio Cobbs, MD;  Location: [facility];  Service: Gynecology;  Laterality: N/A;  . CYSTOSCOPY N/A 11/02/2015   Procedure: CYSTOSCOPY;  Surgeon:  Nunzio Cobbs, MD;  Location: [facility];  Service: Gynecology;  Laterality: N/A;  . SALPINGOOPHORECTOMY Bilateral 11/02/2015   Procedure: SALPINGO OOPHORECTOMY;  Surgeon: Nunzio Cobbs, MD;  Location: [facility];  Service: Gynecology;  Laterality: Bilateral;  . WISDOM TOOTH EXTRACTION     age 57    Social History   Tobacco Use  . Smoking status: Former Smoker    Types: Cigarettes  . Smokeless tobacco: Never Used  Substance Use Topics  . Alcohol use: Yes    Alcohol/week: 1.2 oz    Types: 2 Standard drinks or equivalent per week    Comment: occasionally 1 time monthly  . Drug use: No    Family History  Problem Relation Age of Onset  . Hypertension Father   . Brain cancer Mother   . Lymphoma Mother   . Colon cancer Neg Hx   . Pancreatic cancer Neg Hx   . Stomach cancer Neg Hx     No Known Allergies  Medication list has been reviewed and updated.  Current Outpatient Medications on File Prior to Visit  Medication Sig Dispense Refill  . ESTRACE VAGINAL 0.1 MG/GM vaginal cream USE 1/2 G VAGINALLY EVERY NIGHT FOR THE FIRST 2 WEEKS, THEN USE 1/2 G VAGINALLY TWO TIMES A WEEK. 42.5 g 1  . NIFEdipine (PROCARDIA-XL/ADALAT-CC/NIFEDICAL-XL) 30 MG 24 hr tablet TAKE 1 TABLET BY MOUTH TWICE DAILY 180 tablet 1   No current facility-administered medications on file prior to visit.     Review of Systems:  As per HPI- otherwise negative. No CP or SOB No GI concerns No urinary concerns   Physical Examination: Vitals:   01/02/18 1335  BP: (!) 164/82  Pulse: 89  Temp: 98.7 F (37.1 C)  SpO2: 96%   Vitals:   01/02/18 1335  Weight: 185 lb 12.8 oz (84.3 kg)  Height: 5\' 6"  (1.676 m)   Body mass index is 29.99 kg/m. Ideal Body Weight: Weight in (lb) to have BMI = 25: 154.6  GEN: WDWN, NAD, Non-toxic, A & O x 3, looks well, overweight  HEENT: Atraumatic, Normocephalic. Neck supple. No masses, No LAD.  Bilateral TM wnl, oropharynx normal.  PEERL,EOMI.   Ears and  Nose: No external deformity. CV: RRR, No M/G/R. No JVD. No thrill. No extra heart sounds. PULM: CTA B, no wheezes, crackles, rhonchi. No retractions. No resp. distress. No accessory muscle use. ABD: S, NT, ND, +BS. No rebound. No HSM. EXTR: No c/c/e NEURO Normal gait.  PSYCH: Normally interactive. Conversant. Not depressed or anxious appearing.  Calm demeanor.    Assessment and Plan: Physical exam  Essential hypertension - Plan: olmesartan (BENICAR) 40 MG tablet, CBC  Screening for  hyperlipidemia - Plan: Lipid panel  Pre-diabetes - Plan: Comprehensive metabolic panel, Hemoglobin A1c  CPE today She is overall doing well and feeling great Labs pending as above She wishes she could lose weight but this is not easy for her.  She will continue to exercise and eat right Will plan further follow- up pending labs. She will continue to monitor her BP at home and will let me know if this starts to run higher   Signed Lamar Blinks, MD  Received her labs 4/19  Message to pt: Blood count is normal Cholesterol is overall quite good Your A1c (average blood sugar over the previous 3 months) is in the pre-diabetes range.  This means that you do not have diabetes, but you may be at higher risk for developing this disease later on.   Continue to exercise and eat right, and we will check this again in a year.    Your metabolic profile is normal except for low sodium.  You sodium level is not dangerously low- I know that we have discussed this in the past.  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  I would like to have you see nephrology to get their opinion on this- what do you think?  Take care, we can otherwise plan to visit in 6 months  Results for orders placed or performed in visit on 01/02/18  CBC  Result Value Ref Range   WBC 5.5 4.0 - 10.5 K/uL   RBC 4.10 3.87 - 5.11 Mil/uL   Platelets 199.0  150.0 - 400.0 K/uL   Hemoglobin 13.3 12.0 - 15.0 g/dL   HCT 38.0 36.0 - 46.0 %   MCV 92.9 78.0 - 100.0 fl   MCHC 34.9 30.0 - 36.0 g/dL   RDW 12.7 11.5 - 15.5 %  Comprehensive metabolic panel  Result Value Ref Range   Sodium 130 (L) 135 - 145 mEq/L   Potassium 3.9 3.5 - 5.1 mEq/L   Chloride 95 (L) 96 - 112 mEq/L   CO2 25 19 - 32 mEq/L   Glucose, Bld 98 70 - 99 mg/dL   BUN 19 6 - 23 mg/dL   Creatinine, Ser 0.91 0.40 - 1.20 mg/dL   Total Bilirubin 0.6 0.2 - 1.2 mg/dL   Alkaline Phosphatase 50 39 - 117 U/L   AST 16 0 - 37 U/L   ALT 18 0 - 35 U/L   Total Protein 7.0 6.0 - 8.3 g/dL   Albumin 4.7 3.5 - 5.2 g/dL   Calcium 9.3 8.4 - 10.5 mg/dL   GFR 66.18 >60.00 mL/min  Hemoglobin A1c  Result Value Ref Range   Hgb A1c MFr Bld 6.1 4.6 - 6.5 %  Lipid panel  Result Value Ref Range   Cholesterol 189 0 - 200 mg/dL   Triglycerides 56.0 0.0 - 149.0 mg/dL   HDL 71.00 >39.00 mg/dL   VLDL 11.2 0.0 - 40.0 mg/dL   LDL Cholesterol 106 (H) 0 - 99 mg/dL   Total CHOL/HDL Ratio 3    NonHDL 117.63

## 2018-01-02 ENCOUNTER — Ambulatory Visit (INDEPENDENT_AMBULATORY_CARE_PROVIDER_SITE_OTHER): Payer: 59 | Admitting: Family Medicine

## 2018-01-02 ENCOUNTER — Encounter: Payer: Self-pay | Admitting: Family Medicine

## 2018-01-02 VITALS — BP 164/82 | HR 89 | Temp 98.7°F | Ht 66.0 in | Wt 185.8 lb

## 2018-01-02 DIAGNOSIS — R7303 Prediabetes: Secondary | ICD-10-CM

## 2018-01-02 DIAGNOSIS — I10 Essential (primary) hypertension: Secondary | ICD-10-CM | POA: Diagnosis not present

## 2018-01-02 DIAGNOSIS — Z13220 Encounter for screening for lipoid disorders: Secondary | ICD-10-CM

## 2018-01-02 DIAGNOSIS — Z0000 Encounter for general adult medical examination without abnormal findings: Secondary | ICD-10-CM | POA: Diagnosis not present

## 2018-01-02 LAB — COMPREHENSIVE METABOLIC PANEL
ALBUMIN: 4.7 g/dL (ref 3.5–5.2)
ALK PHOS: 50 U/L (ref 39–117)
ALT: 18 U/L (ref 0–35)
AST: 16 U/L (ref 0–37)
BUN: 19 mg/dL (ref 6–23)
CALCIUM: 9.3 mg/dL (ref 8.4–10.5)
CHLORIDE: 95 meq/L — AB (ref 96–112)
CO2: 25 mEq/L (ref 19–32)
Creatinine, Ser: 0.91 mg/dL (ref 0.40–1.20)
GFR: 66.18 mL/min (ref >60.00)
Glucose, Bld: 98 mg/dL (ref 70–99)
POTASSIUM: 3.9 meq/L (ref 3.5–5.1)
Sodium: 130 mEq/L — ABNORMAL LOW (ref 135–145)
TOTAL PROTEIN: 7 g/dL (ref 6.0–8.3)
Total Bilirubin: 0.6 mg/dL (ref 0.2–1.2)

## 2018-01-02 LAB — CBC
HCT: 38 % (ref 36.0–46.0)
Hemoglobin: 13.3 g/dL (ref 12.0–15.0)
MCHC: 34.9 g/dL (ref 30.0–36.0)
MCV: 92.9 fl (ref 78.0–100.0)
Platelets: 199 10*3/uL (ref 150.0–400.0)
RBC: 4.1 Mil/uL (ref 3.87–5.11)
RDW: 12.7 % (ref 11.5–15.5)
WBC: 5.5 10*3/uL (ref 4.0–10.5)

## 2018-01-02 LAB — LIPID PANEL
CHOLESTEROL: 189 mg/dL (ref 0–200)
HDL: 71 mg/dL (ref >39.00)
LDL Cholesterol: 106 mg/dL — ABNORMAL HIGH (ref 0–99)
NONHDL: 117.63
Total CHOL/HDL Ratio: 3
Triglycerides: 56 mg/dL (ref 0.0–149.0)
VLDL: 11.2 mg/dL (ref 0.0–40.0)

## 2018-01-02 LAB — HEMOGLOBIN A1C: HEMOGLOBIN A1C: 6.1 % (ref 4.6–6.5)

## 2018-01-02 MED ORDER — OLMESARTAN MEDOXOMIL 40 MG PO TABS: 40.0000 mg | ORAL_TABLET | Freq: Every day | ORAL | 3 refills | Status: DC

## 2018-01-02 MED FILL — OLMESARTAN MEDOXOMIL 40 MG: 40 | 90 days supply | Qty: 90 | Fill #0

## 2018-01-02 NOTE — Patient Instructions (Signed)
Great to see you as always Take care and I will be in touch with your labs asap Continue to monitor your home blood pressures- if you start running higher than 140/90 at home please alert me    Health Maintenance for Postmenopausal Women Menopause is a normal process in which your reproductive ability comes to an end. This process happens gradually over a span of months to years, usually between the ages of 37 and 42. Menopause is complete when you have missed 12 consecutive menstrual periods. It is important to talk with your health care provider about some of the most common conditions that affect postmenopausal women, such as heart disease, cancer, and bone loss (osteoporosis). Adopting a healthy lifestyle and getting preventive care can help to promote your health and wellness. Those actions can also lower your chances of developing some of these common conditions. What should I know about menopause? During menopause, you may experience a number of symptoms, such as:  Moderate-to-severe hot flashes.  Night sweats.  Decrease in sex drive.  Mood swings.  Headaches.  Tiredness.  Irritability.  Memory problems.  Insomnia.  Choosing to treat or not to treat menopausal changes is an individual decision that you make with your health care provider. What should I know about hormone replacement therapy and supplements? Hormone therapy products are effective for treating symptoms that are associated with menopause, such as hot flashes and night sweats. Hormone replacement carries certain risks, especially as you become older. If you are thinking about using estrogen or estrogen with progestin treatments, discuss the benefits and risks with your health care provider. What should I know about heart disease and stroke? Heart disease, heart attack, and stroke become more likely as you age. This may be due, in part, to the hormonal changes that your body experiences during menopause. These can  affect how your body processes dietary fats, triglycerides, and cholesterol. Heart attack and stroke are both medical emergencies. There are many things that you can do to help prevent heart disease and stroke:  Have your blood pressure checked at least every 1-2 years. High blood pressure causes heart disease and increases the risk of stroke.  If you are 28-51 years old, ask your health care provider if you should take aspirin to prevent a heart attack or a stroke.  Do not use any tobacco products, including cigarettes, chewing tobacco, or electronic cigarettes. If you need help quitting, ask your health care provider.  It is important to eat a healthy diet and maintain a healthy weight. ? Be sure to include plenty of vegetables, fruits, low-fat dairy products, and lean protein. ? Avoid eating foods that are high in solid fats, added sugars, or salt (sodium).  Get regular exercise. This is one of the most important things that you can do for your health. ? Try to exercise for at least 150 minutes each week. The type of exercise that you do should increase your heart rate and make you sweat. This is known as moderate-intensity exercise. ? Try to do strengthening exercises at least twice each week. Do these in addition to the moderate-intensity exercise.  Know your numbers.Ask your health care provider to check your cholesterol and your blood glucose. Continue to have your blood tested as directed by your health care provider.  What should I know about cancer screening? There are several types of cancer. Take the following steps to reduce your risk and to catch any cancer development as early as possible. Breast Cancer  Practice breast self-awareness. ? This means understanding how your breasts normally appear and feel. ? It also means doing regular breast self-exams. Let your health care provider know about any changes, no matter how small.  If you are 50 or older, have a clinician do a  breast exam (clinical breast exam or CBE) every year. Depending on your age, family history, and medical history, it may be recommended that you also have a yearly breast X-ray (mammogram).  If you have a family history of breast cancer, talk with your health care provider about genetic screening.  If you are at high risk for breast cancer, talk with your health care provider about having an MRI and a mammogram every year.  Breast cancer (BRCA) gene test is recommended for women who have family members with BRCA-related cancers. Results of the assessment will determine the need for genetic counseling and BRCA1 and for BRCA2 testing. BRCA-related cancers include these types: ? Breast. This occurs in males or females. ? Ovarian. ? Tubal. This may also be called fallopian tube cancer. ? Cancer of the abdominal or pelvic lining (peritoneal cancer). ? Prostate. ? Pancreatic.  Cervical, Uterine, and Ovarian Cancer Your health care provider may recommend that you be screened regularly for cancer of the pelvic organs. These include your ovaries, uterus, and vagina. This screening involves a pelvic exam, which includes checking for microscopic changes to the surface of your cervix (Pap test).  For women ages 21-65, health care providers may recommend a pelvic exam and a Pap test every three years. For women ages 51-65, they may recommend the Pap test and pelvic exam, combined with testing for human papilloma virus (HPV), every five years. Some types of HPV increase your risk of cervical cancer. Testing for HPV may also be done on women of any age who have unclear Pap test results.  Other health care providers may not recommend any screening for nonpregnant women who are considered low risk for pelvic cancer and have no symptoms. Ask your health care provider if a screening pelvic exam is right for you.  If you have had past treatment for cervical cancer or a condition that could lead to cancer, you need  Pap tests and screening for cancer for at least 20 years after your treatment. If Pap tests have been discontinued for you, your risk factors (such as having a new sexual partner) need to be reassessed to determine if you should start having screenings again. Some women have medical problems that increase the chance of getting cervical cancer. In these cases, your health care provider may recommend that you have screening and Pap tests more often.  If you have a family history of uterine cancer or ovarian cancer, talk with your health care provider about genetic screening.  If you have vaginal bleeding after reaching menopause, tell your health care provider.  There are currently no reliable tests available to screen for ovarian cancer.  Lung Cancer Lung cancer screening is recommended for adults 4-52 years old who are at high risk for lung cancer because of a history of smoking. A yearly low-dose CT scan of the lungs is recommended if you:  Currently smoke.  Have a history of at least 30 pack-years of smoking and you currently smoke or have quit within the past 15 years. A pack-year is smoking an average of one pack of cigarettes per day for one year.  Yearly screening should:  Continue until it has been 15 years since you quit.  Stop if you develop a health problem that would prevent you from having lung cancer treatment.  Colorectal Cancer  This type of cancer can be detected and can often be prevented.  Routine colorectal cancer screening usually begins at age 80 and continues through age 29.  If you have risk factors for colon cancer, your health care provider may recommend that you be screened at an earlier age.  If you have a family history of colorectal cancer, talk with your health care provider about genetic screening.  Your health care provider may also recommend using home test kits to check for hidden blood in your stool.  A small camera at the end of a tube can be used  to examine your colon directly (sigmoidoscopy or colonoscopy). This is done to check for the earliest forms of colorectal cancer.  Direct examination of the colon should be repeated every 5-10 years until age 41. However, if early forms of precancerous polyps or small growths are found or if you have a family history or genetic risk for colorectal cancer, you may need to be screened more often.  Skin Cancer  Check your skin from head to toe regularly.  Monitor any moles. Be sure to tell your health care provider: ? About any new moles or changes in moles, especially if there is a change in a mole's shape or color. ? If you have a mole that is larger than the size of a pencil eraser.  If any of your family members has a history of skin cancer, especially at a young age, talk with your health care provider about genetic screening.  Always use sunscreen. Apply sunscreen liberally and repeatedly throughout the day.  Whenever you are outside, protect yourself by wearing long sleeves, pants, a wide-brimmed hat, and sunglasses.  What should I know about osteoporosis? Osteoporosis is a condition in which bone destruction happens more quickly than new bone creation. After menopause, you may be at an increased risk for osteoporosis. To help prevent osteoporosis or the bone fractures that can happen because of osteoporosis, the following is recommended:  If you are 11-5 years old, get at least 1,000 mg of calcium and at least 600 mg of vitamin D per day.  If you are older than age 41 but younger than age 38, get at least 1,200 mg of calcium and at least 600 mg of vitamin D per day.  If you are older than age 31, get at least 1,200 mg of calcium and at least 800 mg of vitamin D per day.  Smoking and excessive alcohol intake increase the risk of osteoporosis. Eat foods that are rich in calcium and vitamin D, and do weight-bearing exercises several times each week as directed by your health care  provider. What should I know about how menopause affects my mental health? Depression may occur at any age, but it is more common as you become older. Common symptoms of depression include:  Low or sad mood.  Changes in sleep patterns.  Changes in appetite or eating patterns.  Feeling an overall lack of motivation or enjoyment of activities that you previously enjoyed.  Frequent crying spells.  Talk with your health care provider if you think that you are experiencing depression. What should I know about immunizations? It is important that you get and maintain your immunizations. These include:  Tetanus, diphtheria, and pertussis (Tdap) booster vaccine.  Influenza every year before the flu season begins.  Pneumonia vaccine.  Shingles vaccine.  Your  health care provider may also recommend other immunizations. This information is not intended to replace advice given to you by your health care provider. Make sure you discuss any questions you have with your health care provider. Document Released: 10/27/2005 Document Revised: 03/24/2016 Document Reviewed: 06/08/2015 Elsevier Interactive Patient Education  2018 Elsevier Inc.  

## 2018-01-04 ENCOUNTER — Encounter: Payer: Self-pay | Admitting: Family Medicine

## 2018-01-09 ENCOUNTER — Encounter: Payer: Self-pay | Admitting: Family Medicine

## 2018-01-09 DIAGNOSIS — E871 Hypo-osmolality and hyponatremia: Secondary | ICD-10-CM

## 2018-03-05 MED FILL — NIFEDIPINE ER 30 MG TABLET: 30 | 90 days supply | Qty: 180 | Fill #1

## 2018-03-13 ENCOUNTER — Other Ambulatory Visit: Payer: Self-pay | Admitting: Obstetrics and Gynecology

## 2018-03-13 DIAGNOSIS — Z1231 Encounter for screening mammogram for malignant neoplasm of breast: Secondary | ICD-10-CM

## 2018-03-27 MED FILL — OLMESARTAN MEDOXOMIL 40 MG: 40 | 90 days supply | Qty: 90 | Fill #1

## 2018-04-29 ENCOUNTER — Ambulatory Visit
Admission: RE | Admit: 2018-04-29 | Discharge: 2018-04-29 | Disposition: A | Discharge status: Home / Self Care | Payer: 59 | Source: Ambulatory Visit | Attending: Obstetrics and Gynecology | Admitting: Obstetrics and Gynecology

## 2018-04-29 DIAGNOSIS — Z1231 Encounter for screening mammogram for malignant neoplasm of breast: Secondary | ICD-10-CM

## 2018-06-03 ENCOUNTER — Other Ambulatory Visit: Payer: Self-pay | Admitting: Family Medicine

## 2018-06-04 MED FILL — NIFEDIPINE ER 30 MG TABLET: 30 | 90 days supply | Qty: 180 | Fill #0

## 2018-07-01 MED FILL — OLMESARTAN MEDOXOMIL 40 MG: 40 | 90 days supply | Qty: 90 | Fill #2

## 2018-07-24 ENCOUNTER — Encounter: Payer: Self-pay | Admitting: Obstetrics and Gynecology

## 2018-07-24 ENCOUNTER — Other Ambulatory Visit: Payer: Self-pay

## 2018-07-24 ENCOUNTER — Ambulatory Visit (INDEPENDENT_AMBULATORY_CARE_PROVIDER_SITE_OTHER): Payer: 59 | Admitting: Obstetrics and Gynecology

## 2018-07-24 VITALS — BP 148/78 | HR 84 | Resp 18 | Ht 66.0 in | Wt 189.0 lb

## 2018-07-24 DIAGNOSIS — Z01419 Encounter for gynecological examination (general) (routine) without abnormal findings: Secondary | ICD-10-CM | POA: Diagnosis not present

## 2018-07-24 DIAGNOSIS — Z780 Asymptomatic menopausal state: Secondary | ICD-10-CM | POA: Diagnosis not present

## 2018-07-24 MED ORDER — ESTRADIOL 0.1 MG/GM VA CREA: TOPICAL_CREAM | VAGINAL | 1 refills | Status: DC

## 2018-07-24 NOTE — Progress Notes (Addendum)
62 y.o. G63P2002 Married Caucasian female here for annual exam.    Sees her PCP yearly and has breast exams done there.   Using vaginal estrogen once a week.  Having some vaginal dryness. No vaginal bleeding or pain.  Bladder and bowel function fine per patient.  No urinary incontinence.   Asking for clarification of her surgical procedure and what she feels vaginally when she does palpation to the vaginal and urethral area.   PCP:  Silvestre Mesi, MD   Patient's last menstrual period was 09/18/2008 (approximate).           Sexually active: Yes.   female The current method of family planning is vasectomy and status post hysterectomy/bil.salpingo-oophorectomy 10/2015.     Exercising: Yes.    swimming and walking Smoker:  no  Health Maintenance: Pap: 08-19-14 Neg:Neg HR HPV History of abnormal Pap: Hx of ASCUS paps but Neg HPV MMG: 04-29-18 Neg/density B/BiRads1 Colonoscopy: 10-30-13 normal with Dr. Monna Fam 2025.   BMD: 06-29-10  Result : Normal.     TDaP:  03-16-15 Gardasil:   no HIV:05-18-14 NR Hep C: 07-03-16 Neg Screening Labs:  Hb today: PCP   reports that she has quit smoking. Her smoking use included cigarettes. She has never used smokeless tobacco. She reports that she drinks about 2.0 standard drinks of alcohol per week. She reports that she does not use drugs.  Past Medical History:  Diagnosis Date  . Anxiety   . History of cystocele   . History of shingles 2015   two occurances  . Hypertension     Past Surgical History:  Procedure Laterality Date  . ABDOMINAL HYSTERECTOMY N/A 11/02/2015   Procedure: HYSTERECTOMY ABDOMINAL;  Surgeon: Nunzio Cobbs, MD;  Location: [facility];  Service: Gynecology;  Laterality: N/A;  4 hours  . ABDOMINAL SACROCOLPOPEXY N/A 11/02/2015   Procedure: ABDOMINO SACROCOLPOPEXY with Alyte mesh and Halban's Culdoplasty;  Surgeon: Nunzio Cobbs, MD;  Location: [facility];  Service: Gynecology;  Laterality: N/A;  . ANTERIOR AND  POSTERIOR REPAIR N/A 11/02/2015   Procedure: POSTERIOR REPAIR (RECTOCELE) ;  Surgeon: Nunzio Cobbs, MD;  Location: [facility];  Service: Gynecology;  Laterality: N/A;  . BLADDER SUSPENSION N/A 11/02/2015   Procedure: TRANSVAGINAL TAPE (TVT) PROCEDURE exact mid-urethral sling;  Surgeon: Nunzio Cobbs, MD;  Location: [facility];  Service: Gynecology;  Laterality: N/A;  . CYSTOSCOPY N/A 11/02/2015   Procedure: CYSTOSCOPY;  Surgeon: Nunzio Cobbs, MD;  Location: [facility];  Service: Gynecology;  Laterality: N/A;  . SALPINGOOPHORECTOMY Bilateral 11/02/2015   Procedure: SALPINGO OOPHORECTOMY;  Surgeon: Nunzio Cobbs, MD;  Location: [facility];  Service: Gynecology;  Laterality: Bilateral;  . WISDOM TOOTH EXTRACTION     age 18    Current Outpatient Medications  Medication Sig Dispense Refill  . ESTRACE VAGINAL 0.1 MG/GM vaginal cream USE 1/2 G VAGINALLY EVERY NIGHT FOR THE FIRST 2 WEEKS, THEN USE 1/2 G VAGINALLY TWO TIMES A WEEK. 42.5 g 1  . NIFEdipine (PROCARDIA-XL/ADALAT-CC/NIFEDICAL-XL) 30 MG 24 hr tablet TAKE 1 TABLET BY MOUTH TWICE DAILY 180 tablet 0  . olmesartan (BENICAR) 40 MG tablet Take 1 tablet (40 mg total) by mouth daily. 90 tablet 3   No current facility-administered medications for this visit.     Family History  Problem Relation Age of Onset  . Hypertension Father   . Brain cancer Mother   . Lymphoma Mother   . Colon  cancer Neg Hx   . Pancreatic cancer Neg Hx   . Stomach cancer Neg Hx     Review of Systems  All other systems reviewed and are negative.   Exam:   BP (!) 148/78 (BP Location: Right Arm, Patient Position: Sitting, Cuff Size: Large)   Pulse 84   Resp 18   Ht 5\' 6"  (1.676 m)   Wt 189 lb (85.7 kg)   LMP 09/18/2008 (Approximate)   BMI 30.51 kg/m     General appearance: alert, cooperative and appears stated age Head: Normocephalic, without obvious abnormality, atraumatic Neck: no adenopathy, supple, symmetrical, trachea midline  and thyroid normal to inspection and palpation Lungs: clear to auscultation bilaterally Breasts: normal appearance, no masses or tenderness, No nipple retraction or dimpling, No nipple discharge or bleeding, No axillary or supraclavicular adenopathy Heart: regular rate and rhythm Abdomen: soft, non-tender; no masses, no organomegaly Extremities: extremities normal, atraumatic, no cyanosis or edema Skin: Skin color, texture, turgor normal. No rashes or lesions Lymph nodes: Cervical, supraclavicular, and axillary nodes normal. No abnormal inguinal nodes palpated Neurologic: Grossly normal  Pelvic: External genitalia:  no lesions              Urethra:  normal appearing urethra with no masses, tenderness or lesions              Bartholins and Skenes: normal                 Vagina: normal appearing vagina with normal color and discharge, no lesions.  First degree cystocele.               Cervix: no lesions              Pap taken: No. Bimanual Exam:  Uterus:   Absent.              Adnexa: no mass, fullness, tenderness              Rectal exam: Yes.  .  Confirms.              Anus:  normal sphincter tone, no lesions  Chaperone was present for exam.  Assessment:   Well woman visit with normal exam. Status post TAH/BSO/Sacrocolpopexy/post repair/TVT/cysto.  First degree cystocele.   Plan: Mammogram screening. Recommended self breast awareness. Pap and HR HPV as above. Guidelines for Calcium, Vitamin D, regular exercise program including cardiovascular and weight bearing exercise. Pelvic organ surgery operative report and procedure reviewed with patient.   3D pelvic model used to explain the anatomy.  I have discussed anterior colporrhaphy for symptomatic cystocele.  She has ACOG handouts at home on pelvic organ prolapse.  We discussed up to 30% recurrence rates of prolapse and incontinence. I did refill her vaginal estrogen cream.  We discussed potential effect on breast cancer.  Follow  up annually and prn.     After visit summary provided.

## 2018-07-24 NOTE — Patient Instructions (Signed)

## 2018-07-29 ENCOUNTER — Telehealth: Payer: Self-pay | Admitting: Obstetrics and Gynecology

## 2018-07-29 ENCOUNTER — Encounter: Payer: Self-pay | Admitting: Obstetrics and Gynecology

## 2018-07-29 MED ORDER — ESTRADIOL 10 MCG VA TABS: 1.0000 | ORAL_TABLET | VAGINAL | 3 refills | Status: DC

## 2018-07-29 NOTE — Telephone Encounter (Signed)
Routing to Dr. Quincy Simmonds for Rx change.  Okay to change to Vagifem generic tablets?

## 2018-07-29 NOTE — Telephone Encounter (Signed)
Ms. Suchan,   Dr. Quincy Simmonds sent the prescription for you for the generic estradiol vaginal tablets. You will place 1 tablet vaginally twice weekly. She sent a 90 day supply with refills to the [facility].Please let us know if you need anything else!   Sent via [employer].  Encounter closed.

## 2018-07-29 NOTE — Telephone Encounter (Signed)
Ok for generic Vagifem for one year.

## 2018-07-29 NOTE — Telephone Encounter (Signed)
Dr. Quincy Simmonds,     The prescription for Estrace vaginal cream is going to cost $185.00.  Is there a less expensive option?  The pharmacy suggested I ask you about Estradiol vaginal tablets, 90 day supply $54.11.  If this is an option, would I use it up to 3x week also?    Luiz Ochoa

## 2018-08-05 ENCOUNTER — Telehealth: Payer: Self-pay | Admitting: Obstetrics and Gynecology

## 2018-08-05 NOTE — Telephone Encounter (Signed)
Message left to return call to Sarah Rangel at [phone].    

## 2018-08-05 NOTE — Telephone Encounter (Signed)
Patient has 2 options.  Compounded estradiol cream, 0.02% from [facility] or [facility]:  1/2 gram pv at hs twice weekly.  Disp:  8 gram tube RF: 11  OR  Estring Sig:  Place in the vaginal for 90 days Disp:  1 RF:  3

## 2018-08-05 NOTE — Telephone Encounter (Signed)
Routing to Dr. Quincy Simmonds to review and advise on prescription.

## 2018-08-05 NOTE — Telephone Encounter (Signed)
Patient sent the following correspondence through [employer]. Routing to triage to assist patient with request.  Dr. Quincy Simmonds,    I was given some wrong information by my prescription benefit company, [employer].  It turns out both the Estrace vaginal cream($185.00) and the estradiol vaginal tablets ($350.00) are expensive. Are there any other options?    I appreciate your time and help with this.    [name]  ----- Message -----  From: Nurse Kallie Edward  Sent: 07/29/2018 11:32 AM EST  To: Sarah Rangel  Subject: RE: Non-Urgent Medical Question  [name],     Dr. Quincy Simmonds sent the prescription for you for the generic estradiol vaginal tablets. You will place 1 tablet vaginally twice weekly. She sent a 90 day supply with refills to the [facility]. Please let us know if you need anything else!     Sincerely,   Karen Chafe, BSN RN-BC  Triage Nurse  [facility]  [address] [place]  [place], [place] [address]  [facility]: [phone]; Fax: [phone]      ----- Message -----   From: Sarah Rangel   Sent: 07/29/2018 8:09 AM EST    To: Arloa Koh, MD  Subject: Non-Urgent Medical Question    Dr. Quincy Simmonds,     The prescription for Estrace vaginal cream is going to cost $185.00.  Is there a less expensive option?  The pharmacy suggested I ask you about Estradiol vaginal tablets, 90 day supply $54.11.  If this is an option, would I use it up to 3x week also?    Sarah Rangel

## 2018-08-06 NOTE — Telephone Encounter (Signed)
Left message to call Casey Fye, RN at [facility] [phone].   

## 2018-08-06 NOTE — Telephone Encounter (Signed)
Patient returned call to the nurse.  

## 2018-08-08 MED ORDER — ESTRADIOL 2 MG VA RING: 2.0000 mg | VAGINAL_RING | VAGINAL | 3 refills | Status: DC

## 2018-08-08 NOTE — Telephone Encounter (Signed)
  Spoke with patient.  She is working on trying to find best option for her insurance plan, has to meet deductible before any coverage on prescriptions.  Has not picked up any because of cost.  Needs order before pharmacy can advise of cost.  Hesitant to try Estring but will see what cost is with savings card.   She will call back if cost is too high for brand Estring for order from [facility].   We discussed [facility] cost/typically $42.00 cash prices for pre-filled syringes.   She will contact us back with her decision.

## 2018-08-13 ENCOUNTER — Encounter: Payer: Self-pay | Admitting: Obstetrics and Gynecology

## 2018-08-13 MED ORDER — NONFORMULARY OR COMPOUNDED ITEM: 3 refills | Status: DC

## 2018-08-13 NOTE — Telephone Encounter (Signed)
Patient sent mychart message with request:    The Estring is going to be more than $300 for 90 days, the coupon would not work at [employer]. I understood from our previous conversation that Dr. Quincy Simmonds could prescribeestrogen vaginal cream compound. I would pay $42 out of pocket for a 90 day supply of 24 pre filled syringes.  Would you send a prescription for this to [facility]?  Thanks, Sarah Rangel

## 2018-08-13 NOTE — Telephone Encounter (Signed)
Rx pended for [facility].  Pre-filled Syringes.  Estradiol 0.02% cream in pre-filled syringes, 77ml pv twice weekly.  Hard Copy Rx to Dr. Quincy Simmonds to review and sign if agreeable to order with changes to prefilled syringe.

## 2018-09-03 ENCOUNTER — Other Ambulatory Visit: Payer: Self-pay | Admitting: Family Medicine

## 2018-09-04 MED FILL — NIFEDIPINE ER 30 MG TABLET: 30 | 90 days supply | Qty: 180 | Fill #0

## 2018-09-18 DIAGNOSIS — M8580 Other specified disorders of bone density and structure, unspecified site: Secondary | ICD-10-CM

## 2018-09-18 HISTORY — DX: Other specified disorders of bone density and structure, unspecified site: M85.80

## 2018-10-07 MED FILL — OLMESARTAN MEDOXOMIL 40 MG: 40 | 90 days supply | Qty: 90 | Fill #3

## 2018-12-02 ENCOUNTER — Telehealth: Payer: Self-pay | Admitting: Family Medicine

## 2018-12-02 MED FILL — NIFEDIPINE ER OSMOTIC RELEA: 30 | 30 days supply | Qty: 60 | Fill #0

## 2018-12-03 ENCOUNTER — Other Ambulatory Visit: Payer: Self-pay | Admitting: Family Medicine

## 2018-12-03 DIAGNOSIS — I10 Essential (primary) hypertension: Secondary | ICD-10-CM

## 2018-12-04 MED ORDER — NIFEDIPINE ER OSMOTIC RELEASE 30 MG PO TB24: 30.0000 mg | ORAL_TABLET | Freq: Two times a day (BID) | ORAL | 0 refills | Status: DC

## 2018-12-04 MED FILL — OLMESARTAN MEDOXOMIL 40 MG: 40 | 90 days supply | Qty: 90 | Fill #0

## 2018-12-04 MED FILL — NIFEDIPINE ER OSMOTIC RELEA: 30 | 90 days supply | Qty: 180 | Fill #0

## 2018-12-04 NOTE — Addendum Note (Signed)
Addended byDamita Dunnings D on: 12/04/2018 02:41 PM   Modules accepted: Orders

## 2018-12-04 NOTE — Telephone Encounter (Signed)
Pt called to schedule appt 01/15/2019 for cpe and asking for 90 day supply of NIFEdipine 30 MG to be sent in for her.   Sarah Rangel, Sarah Rangel

## 2018-12-04 NOTE — Telephone Encounter (Signed)
Refills sent

## 2019-01-08 DIAGNOSIS — H524 Presbyopia: Secondary | ICD-10-CM | POA: Diagnosis not present

## 2019-01-08 DIAGNOSIS — H5203 Hypermetropia, bilateral: Secondary | ICD-10-CM | POA: Diagnosis not present

## 2019-01-08 DIAGNOSIS — H52222 Regular astigmatism, left eye: Secondary | ICD-10-CM | POA: Diagnosis not present

## 2019-01-08 DIAGNOSIS — H1045 Other chronic allergic conjunctivitis: Secondary | ICD-10-CM | POA: Diagnosis not present

## 2019-01-08 DIAGNOSIS — H25813 Combined forms of age-related cataract, bilateral: Secondary | ICD-10-CM | POA: Diagnosis not present

## 2019-01-08 DIAGNOSIS — H40059 Ocular hypertension, unspecified eye: Secondary | ICD-10-CM | POA: Diagnosis not present

## 2019-01-15 ENCOUNTER — Encounter: Payer: 59 | Admitting: Family Medicine

## 2019-03-13 ENCOUNTER — Other Ambulatory Visit: Payer: Self-pay | Admitting: Obstetrics and Gynecology

## 2019-03-13 DIAGNOSIS — Z1231 Encounter for screening mammogram for malignant neoplasm of breast: Secondary | ICD-10-CM

## 2019-03-31 ENCOUNTER — Other Ambulatory Visit: Payer: Self-pay | Admitting: Family Medicine

## 2019-03-31 DIAGNOSIS — I10 Essential (primary) hypertension: Secondary | ICD-10-CM

## 2019-04-01 MED FILL — NIFEdipine ER 30 MG TB24: 30 | 30 days supply | Qty: 60 | Fill #0

## 2019-04-01 MED FILL — OLMESARTAN MEDOXOMIL 40 MG: 40 | 30 days supply | Qty: 30 | Fill #0

## 2019-04-02 ENCOUNTER — Encounter: Payer: 59 | Admitting: Family Medicine

## 2019-04-22 NOTE — Progress Notes (Addendum)
[facility] [place], [place], [place] [address] [phone]  Date:  04/23/2019   Name:  Sarah Rangel   DOB:  09/09/1956   MRN:  [id number]  PCP:  Darreld Mclean, MD    Chief Complaint: Annual Exam (medrefills-90 day supply)   History of Present Illness:  Sarah Rangel is a 63 y.o. very pleasant female patient who presents with the following:  Here today for complete physical History of hypertension, prediabetes She sees Dr. 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 Pre-diabetes 07/04/2016  . White coat hypertension 12/29/2015  . Status post total abdominal hysterectomy and bilateral salpingo-oophorectomy 11/02/2015  . Worsening headaches 12/05/2013  . Unspecified essential hypertension 11/06/2013  . Stress 11/06/2013    Past Medical History:  Diagnosis Date  . Anxiety   . History of cystocele   . History of shingles 2015   two occurances  . Hypertension     Past Surgical History:  Procedure Laterality Date  . ABDOMINAL HYSTERECTOMY N/A 11/02/2015   Procedure:  HYSTERECTOMY ABDOMINAL;  Surgeon: Nunzio Cobbs, MD;  Location: [facility];  Service: Gynecology;  Laterality: N/A;  4 hours  . ABDOMINAL SACROCOLPOPEXY N/A 11/02/2015   Procedure: ABDOMINO SACROCOLPOPEXY with Alyte mesh and Halban's Culdoplasty;  Surgeon: Nunzio Cobbs, MD;  Location: [facility];  Service: Gynecology;  Laterality: N/A;  . ANTERIOR AND POSTERIOR REPAIR N/A 11/02/2015   Procedure: POSTERIOR REPAIR (RECTOCELE) ;  Surgeon: Nunzio Cobbs, MD;  Location: [facility];  Service: Gynecology;  Laterality: N/A;  . BLADDER SUSPENSION N/A 11/02/2015   Procedure: TRANSVAGINAL TAPE (TVT) PROCEDURE exact mid-urethral sling;  Surgeon: Nunzio Cobbs, MD;  Location: [facility];  Service: Gynecology;  Laterality: N/A;  . CYSTOSCOPY N/A 11/02/2015   Procedure: CYSTOSCOPY;  Surgeon: Nunzio Cobbs, MD;  Location: [facility];  Service: Gynecology;  Laterality: N/A;  . SALPINGOOPHORECTOMY Bilateral 11/02/2015   Procedure: SALPINGO OOPHORECTOMY;  Surgeon: Nunzio Cobbs, MD;  Location: [facility];  Service: Gynecology;  Laterality: Bilateral;  . WISDOM TOOTH EXTRACTION     age 31    Social History   Tobacco Use  . Smoking status: Former Smoker    Types: Cigarettes  . Smokeless tobacco: Never Used  Substance Use Topics  . Alcohol use: Yes    Alcohol/week: 2.0 standard drinks    Types: 2 Standard drinks or equivalent per week  Comment: occasionally 1 time monthly  . Drug use: No    Family History  Problem Relation Age of Onset  . Hypertension Father   . Brain cancer Mother   . Lymphoma Mother   . Colon cancer Neg Hx   . Pancreatic cancer Neg Hx   . Stomach cancer Neg Hx     No Known Allergies  Medication list has been reviewed and updated.  No current outpatient medications on file prior to visit.   No current facility-administered medications on file prior to visit.     Review of Systems:  As per HPI- otherwise negative. No fever or  chills   Physical Examination: Vitals:   04/23/19 1438 04/23/19 1506  BP: (!) 182/80 (!) 155/80  Pulse: 90   Resp: 16   Temp: 98.1 F (36.7 C)   SpO2: 99%    Vitals:   04/23/19 1438  Weight: 193 lb (87.5 kg)  Height: 5\' 6"  (1.676 m)   Body mass index is 31.15 kg/m. Ideal Body Weight: Weight in (lb) to have BMI = 25: 154.6  GEN: WDWN, NAD, Non-toxic, A & O x 3, overweight, looks well  HEENT: Atraumatic, Normocephalic. Neck supple. No masses, No LAD. TM wno  Ears and Nose: No external deformity. CV: RRR, No M/G/R. No JVD. No thrill. No extra heart sounds. PULM: CTA B, no wheezes, crackles, rhonchi. No retractions. No resp. distress. No accessory muscle use. ABD: S, NT, ND, +BS. No rebound. No HSM. EXTR: No c/c/e NEURO Normal gait.  PSYCH: Normally interactive. Conversant. Not depressed or anxious appearing.  Calm demeanor.    Assessment and Plan:   ICD-10-CM   1. Physical exam  Z00.00   2. Essential hypertension  I10 CBC    Comprehensive metabolic panel    NIFEdipine (PROCARDIA-XL/NIFEDICAL-XL) 30 MG 24 hr tablet    olmesartan (BENICAR) 40 MG tablet  3. Screening for hyperlipidemia  Z13.220 Lipid panel  4. Pre-diabetes  R73.03 Hemoglobin A1c  5. Immunization due  Z23 Pneumococcal conjugate vaccine 13-valent IM   Physical exam today- labs pending as above Gave prevnar Can do pnemovax and shingrix next year Will plan further follow- up pending labs. Continue to monitor BP at home - refilled her BP meds today   Follow-up: No follow-ups on file.  Meds ordered this encounter  Medications  . NIFEdipine (PROCARDIA-XL/NIFEDICAL-XL) 30 MG 24 hr tablet    Sig: TAKE 1 TABLET BY MOUTH TWICE DAILY    Dispense:  180 tablet    Refill:  3  . olmesartan (BENICAR) 40 MG tablet    Sig: TAKE 1 TABLET BY MOUTH ONCE A DAY    Dispense:  90 tablet    Refill:  3   Orders Placed This Encounter  Procedures  . Pneumococcal conjugate vaccine 13-valent IM  . CBC  . Comprehensive  metabolic panel  . Hemoglobin A1c  . Lipid panel    @SIGN @    Signed Lamar Blinks, MD  Received her labs, message to pt a1c is stable The 10-year ASCVD risk score Mikey Bussing DC Brooke Bonito., et al., 2013) is: 10.2%   Values used to calculate the score:     Age: 38 years     Sex: Female     Is Non-Hispanic African American: No     Diabetic: No     Tobacco smoker: No     Systolic Blood Pressure: 297 mmHg     Is BP treated: Yes     HDL Cholesterol: 66.6 mg/dL  Total Cholesterol: 209 mg/dL   Results for orders placed or performed in visit on 04/23/19  CBC  Result Value Ref Range   WBC 6.2 4.0 - 10.5 K/uL   RBC 4.12 3.87 - 5.11 Mil/uL   Platelets 203.0 150.0 - 400.0 K/uL   Hemoglobin 13.1 12.0 - 15.0 g/dL   HCT 38.6 36.0 - 46.0 %   MCV 93.8 78.0 - 100.0 fl   MCHC 34.0 30.0 - 36.0 g/dL   RDW 12.5 11.5 - 15.5 %  Comprehensive metabolic panel  Result Value Ref Range   Sodium 135 135 - 145 mEq/L   Potassium 4.4 3.5 - 5.1 mEq/L   Chloride 101 96 - 112 mEq/L   CO2 25 19 - 32 mEq/L   Glucose, Bld 97 70 - 99 mg/dL   BUN 22 6 - 23 mg/dL   Creatinine, Ser 1.00 0.40 - 1.20 mg/dL   Total Bilirubin 0.5 0.2 - 1.2 mg/dL   Alkaline Phosphatase 61 39 - 117 U/L   AST 16 0 - 37 U/L   ALT 15 0 - 35 U/L   Total Protein 7.3 6.0 - 8.3 g/dL   Albumin 5.2 3.5 - 5.2 g/dL   Calcium 9.8 8.4 - 10.5 mg/dL   GFR 55.62 (L) >60.00 mL/min  Hemoglobin A1c  Result Value Ref Range   Hgb A1c MFr Bld 6.3 4.6 - 6.5 %  Lipid panel  Result Value Ref Range   Cholesterol 209 (H) 0 - 200 mg/dL   Triglycerides 81.0 0.0 - 149.0 mg/dL   HDL 66.60 >39.00 mg/dL   VLDL 16.2 0.0 - 40.0 mg/dL   LDL Cholesterol 126 (H) 0 - 99 mg/dL   Total CHOL/HDL Ratio 3    NonHDL 142.White Signal

## 2019-04-23 ENCOUNTER — Other Ambulatory Visit: Payer: Self-pay

## 2019-04-23 ENCOUNTER — Encounter: Payer: Self-pay | Admitting: Family Medicine

## 2019-04-23 ENCOUNTER — Ambulatory Visit (INDEPENDENT_AMBULATORY_CARE_PROVIDER_SITE_OTHER): Payer: 59 | Admitting: Family Medicine

## 2019-04-23 VITALS — BP 155/80 | HR 90 | Temp 98.1°F | Resp 16 | Ht 66.0 in | Wt 193.0 lb

## 2019-04-23 DIAGNOSIS — I10 Essential (primary) hypertension: Secondary | ICD-10-CM

## 2019-04-23 DIAGNOSIS — Z0000 Encounter for general adult medical examination without abnormal findings: Secondary | ICD-10-CM | POA: Diagnosis not present

## 2019-04-23 DIAGNOSIS — R7303 Prediabetes: Secondary | ICD-10-CM

## 2019-04-23 DIAGNOSIS — Z23 Encounter for immunization: Secondary | ICD-10-CM

## 2019-04-23 DIAGNOSIS — Z13220 Encounter for screening for lipoid disorders: Secondary | ICD-10-CM

## 2019-04-23 MED ORDER — NIFEDIPINE ER OSMOTIC RELEASE 30 MG PO TB24: ORAL_TABLET | ORAL | 3 refills | Status: DC

## 2019-04-23 MED ORDER — OLMESARTAN MEDOXOMIL 40 MG PO TABS: ORAL_TABLET | ORAL | 3 refills | Status: DC

## 2019-04-23 MED FILL — NIFEDIPINE ER OSMOTIC RELEA: 30 | 90 days supply | Qty: 180 | Fill #0

## 2019-04-23 NOTE — Patient Instructions (Signed)
Great to see you today-  I will be in touch with your labs Let's plan to visit in about 6 months 2nd pneumonia shot due in one year  We can do the shingrix series next year    Health Maintenance After Age 65 After age 44, you are at a higher risk for certain long-term diseases and infections as well as injuries from falls. Falls are a major cause of broken bones and head injuries in people who are older than age 13. Getting regular preventive care can help to keep you healthy and well. Preventive care includes getting regular testing and making lifestyle changes as recommended by your health care provider. Talk with your health care provider about:  Which screenings and tests you should have. A screening is a test that checks for a disease when you have no symptoms.  A diet and exercise plan that is right for you. What should I know about screenings and tests to prevent falls? Screening and testing are the best ways to find a health problem early. Early diagnosis and treatment give you the best chance of managing medical conditions that are common after age 76. Certain conditions and lifestyle choices may make you more likely to have a fall. Your health care provider may recommend:  Regular vision checks. Poor vision and conditions such as cataracts can make you more likely to have a fall. If you wear glasses, make sure to get your prescription updated if your vision changes.  Medicine review. Work with your health care provider to regularly review all of the medicines you are taking, including over-the-counter medicines. Ask your health care provider about any side effects that may make you more likely to have a fall. Tell your health care provider if any medicines that you take make you feel dizzy or sleepy.  Osteoporosis screening. Osteoporosis is a condition that causes the bones to get weaker. This can make the bones weak and cause them to break more easily.  Blood pressure screening. Blood  pressure changes and medicines to control blood pressure can make you feel dizzy.  Strength and balance checks. Your health care provider may recommend certain tests to check your strength and balance while standing, walking, or changing positions.  Foot health exam. Foot pain and numbness, as well as not wearing proper footwear, can make you more likely to have a fall.  Depression screening. You may be more likely to have a fall if you have a fear of falling, feel emotionally low, or feel unable to do activities that you used to do.  Alcohol use screening. Using too much alcohol can affect your balance and may make you more likely to have a fall. What actions can I take to lower my risk of falls? General instructions  Talk with your health care provider about your risks for falling. Tell your health care provider if: ? You fall. Be sure to tell your health care provider about all falls, even ones that seem minor. ? You feel dizzy, sleepy, or off-balance.  Take over-the-counter and prescription medicines only as told by your health care provider. These include any supplements.  Eat a healthy diet and maintain a healthy weight. A healthy diet includes low-fat dairy products, low-fat (lean) meats, and fiber from whole grains, beans, and lots of fruits and vegetables. Home safety  Remove any tripping hazards, such as rugs, cords, and clutter.  Install safety equipment such as grab bars in bathrooms and safety rails on stairs.  Keep rooms  and walkways well-lit. Activity   Follow a regular exercise program to stay fit. This will help you maintain your balance. Ask your health care provider what types of exercise are appropriate for you.  If you need a cane or walker, use it as recommended by your health care provider.  Wear supportive shoes that have nonskid soles. Lifestyle  Do not drink alcohol if your health care provider tells you not to drink.  If you drink alcohol, limit how  much you have: ? 0-1 drink a day for women. ? 0-2 drinks a day for men.  Be aware of how much alcohol is in your drink. In the [place] one drink equals one typical bottle of beer (12 oz), one-half glass of wine (5 oz), or one shot of hard liquor (1 oz).  Do not use any products that contain nicotine or tobacco, such as cigarettes and e-cigarettes. If you need help quitting, ask your health care provider. Summary  Having a healthy lifestyle and getting preventive care can help to protect your health and wellness after age 39.  Screening and testing are the best way to find a health problem early and help you avoid having a fall. Early diagnosis and treatment give you the best chance for managing medical conditions that are more common for people who are older than age 24.  Falls are a major cause of broken bones and head injuries in people who are older than age 51. Take precautions to prevent a fall at home.  Work with your health care provider to learn what changes you can make to improve your health and wellness and to prevent falls. This information is not intended to replace advice given to you by your health care provider. Make sure you discuss any questions you have with your health care provider. Document Released: 07/18/2017 Document Revised: 12/26/2018 Document Reviewed: 07/18/2017 Elsevier Patient Education  2020 [employer].

## 2019-04-24 ENCOUNTER — Encounter: Payer: Self-pay | Admitting: Family Medicine

## 2019-04-24 LAB — COMPREHENSIVE METABOLIC PANEL
ALT: 15 U/L (ref 0–35)
AST: 16 U/L (ref 0–37)
Albumin: 5.2 g/dL (ref 3.5–5.2)
Alkaline Phosphatase: 61 U/L (ref 39–117)
BUN: 22 mg/dL (ref 6–23)
CO2: 25 mEq/L (ref 19–32)
Calcium: 9.8 mg/dL (ref 8.4–10.5)
Chloride: 101 mEq/L (ref 96–112)
Creatinine, Ser: 1 mg/dL (ref 0.40–1.20)
GFR: 55.62 mL/min — ABNORMAL LOW (ref >60.00)
Glucose, Bld: 97 mg/dL (ref 70–99)
Potassium: 4.4 mEq/L (ref 3.5–5.1)
Sodium: 135 mEq/L (ref 135–145)
Total Bilirubin: 0.5 mg/dL (ref 0.2–1.2)
Total Protein: 7.3 g/dL (ref 6.0–8.3)

## 2019-04-24 LAB — CBC
HCT: 38.6 % (ref 36.0–46.0)
Hemoglobin: 13.1 g/dL (ref 12.0–15.0)
MCHC: 34 g/dL (ref 30.0–36.0)
MCV: 93.8 fl (ref 78.0–100.0)
Platelets: 203 10*3/uL (ref 150.0–400.0)
RBC: 4.12 Mil/uL (ref 3.87–5.11)
RDW: 12.5 % (ref 11.5–15.5)
WBC: 6.2 10*3/uL (ref 4.0–10.5)

## 2019-04-24 LAB — LIPID PANEL
Cholesterol: 209 mg/dL — ABNORMAL HIGH (ref 0–200)
HDL: 66.6 mg/dL (ref >39.00)
LDL Cholesterol: 126 mg/dL — ABNORMAL HIGH (ref 0–99)
NonHDL: 142.31
Total CHOL/HDL Ratio: 3
Triglycerides: 81 mg/dL (ref 0.0–149.0)
VLDL: 16.2 mg/dL (ref 0.0–40.0)

## 2019-04-24 LAB — HEMOGLOBIN A1C: Hgb A1c MFr Bld: 6.3 % (ref 4.6–6.5)

## 2019-04-25 MED FILL — OLMESARTAN MEDOXOMIL 40 MG: 40 | 30 days supply | Qty: 30 | Fill #0

## 2019-04-30 ENCOUNTER — Encounter: Payer: 59 | Admitting: Family Medicine

## 2019-05-20 MED FILL — OLMESARTAN MEDOXOMIL 40 MG: 40 | 30 days supply | Qty: 30 | Fill #1

## 2019-06-04 ENCOUNTER — Other Ambulatory Visit: Payer: 59

## 2019-06-04 ENCOUNTER — Ambulatory Visit: Payer: 59

## 2019-06-16 MED FILL — OLMESARTAN MEDOXOMIL 40 MG: 40 | 30 days supply | Qty: 30 | Fill #2

## 2019-06-23 ENCOUNTER — Other Ambulatory Visit: Payer: Self-pay | Admitting: Obstetrics and Gynecology

## 2019-06-23 DIAGNOSIS — Z780 Asymptomatic menopausal state: Secondary | ICD-10-CM

## 2019-06-23 DIAGNOSIS — E2839 Other primary ovarian failure: Secondary | ICD-10-CM

## 2019-06-25 ENCOUNTER — Ambulatory Visit
Admission: RE | Admit: 2019-06-25 | Discharge: 2019-06-25 | Disposition: A | Discharge status: Home / Self Care | Payer: 59 | Source: Ambulatory Visit | Attending: Obstetrics and Gynecology | Admitting: Obstetrics and Gynecology

## 2019-06-25 ENCOUNTER — Other Ambulatory Visit: Payer: Self-pay

## 2019-06-25 DIAGNOSIS — Z780 Asymptomatic menopausal state: Secondary | ICD-10-CM | POA: Diagnosis not present

## 2019-06-25 DIAGNOSIS — M85851 Other specified disorders of bone density and structure, right thigh: Secondary | ICD-10-CM | POA: Diagnosis not present

## 2019-06-25 DIAGNOSIS — E2839 Other primary ovarian failure: Secondary | ICD-10-CM

## 2019-06-25 DIAGNOSIS — Z1231 Encounter for screening mammogram for malignant neoplasm of breast: Secondary | ICD-10-CM

## 2019-06-25 DIAGNOSIS — Z13820 Encounter for screening for osteoporosis: Secondary | ICD-10-CM | POA: Diagnosis not present

## 2019-06-29 ENCOUNTER — Encounter: Payer: Self-pay | Admitting: Obstetrics and Gynecology

## 2019-07-14 MED FILL — OLMESARTAN MEDOXOMIL 40 MG: 40 | 30 days supply | Qty: 30 | Fill #3

## 2019-07-14 MED FILL — NIFEdipine ER OSMOTIC RELEA: 30 | 90 days supply | Qty: 180 | Fill #1

## 2019-08-12 MED FILL — OLMESARTAN MEDOXOMIL 40 MG: 40 | 30 days supply | Qty: 30 | Fill #4

## 2019-09-08 MED FILL — OLMESARTAN MEDOXOMIL 40 MG: 40 | 30 days supply | Qty: 30 | Fill #5

## 2019-10-11 MED FILL — OLMESARTAN MEDOXOMIL 40 MG: 40 | 30 days supply | Qty: 30 | Fill #6

## 2019-10-11 MED FILL — NIFEdipine ER OSMOTIC RELEA: 30 | 90 days supply | Qty: 180 | Fill #2

## 2019-11-15 MED FILL — OLMESARTAN MEDOXOMIL 40 MG: 40 | 30 days supply | Qty: 30 | Fill #7

## 2019-12-15 MED FILL — OLMESARTAN MEDOXOMIL 40 MG: 40 | 90 days supply | Qty: 90 | Fill #8

## 2020-01-12 MED FILL — NIFEdipine ER OSMOTIC RELEA: 30 | 90 days supply | Qty: 180 | Fill #3

## 2020-03-10 ENCOUNTER — Other Ambulatory Visit: Payer: Self-pay | Admitting: Family Medicine

## 2020-03-10 DIAGNOSIS — I10 Essential (primary) hypertension: Secondary | ICD-10-CM

## 2020-03-11 MED FILL — OLMESARTAN MEDOXOMIL 40 MG: 40 | 90 days supply | Qty: 90 | Fill #0

## 2020-04-06 ENCOUNTER — Other Ambulatory Visit: Payer: Self-pay

## 2020-04-06 ENCOUNTER — Other Ambulatory Visit: Payer: Self-pay | Admitting: Family Medicine

## 2020-04-06 DIAGNOSIS — I10 Essential (primary) hypertension: Secondary | ICD-10-CM

## 2020-04-06 MED ORDER — NIFEDIPINE ER OSMOTIC RELEASE 30 MG PO TB24: ORAL_TABLET | ORAL | 0 refills | Status: DC

## 2020-04-06 MED FILL — NIFEdipine ER OSMOTIC RELEA: 30 | 90 days supply | Qty: 180 | Fill #0

## 2020-04-27 DIAGNOSIS — H2513 Age-related nuclear cataract, bilateral: Secondary | ICD-10-CM | POA: Diagnosis not present

## 2020-04-27 DIAGNOSIS — H5203 Hypermetropia, bilateral: Secondary | ICD-10-CM | POA: Diagnosis not present

## 2020-04-27 DIAGNOSIS — H25813 Combined forms of age-related cataract, bilateral: Secondary | ICD-10-CM | POA: Diagnosis not present

## 2020-04-27 DIAGNOSIS — H52222 Regular astigmatism, left eye: Secondary | ICD-10-CM | POA: Diagnosis not present

## 2020-04-27 DIAGNOSIS — H524 Presbyopia: Secondary | ICD-10-CM | POA: Diagnosis not present

## 2020-05-04 NOTE — Patient Instructions (Addendum)
Good to see you again today, I will be in touch with your labs as soon as possible Continue good diet and exercise habits Pneumonia booster today I think you can get your shingles vaccine (shingrix) at your pharmacy- let me know if you need to get this done here    Health Maintenance After Age 66 After age 70, you are at a higher risk for certain long-term diseases and infections as well as injuries from falls. Falls are a major cause of broken bones and head injuries in people who are older than age 56. Getting regular preventive care can help to keep you healthy and well. Preventive care includes getting regular testing and making lifestyle changes as recommended by your health care provider. Talk with your health care provider about:  Which screenings and tests you should have. A screening is a test that checks for a disease when you have no symptoms.  A diet and exercise plan that is right for you. What should I know about screenings and tests to prevent falls? Screening and testing are the best ways to find a health problem early. Early diagnosis and treatment give you the best chance of managing medical conditions that are common after age 31. Certain conditions and lifestyle choices may make you more likely to have a fall. Your health care provider may recommend:  Regular vision checks. Poor vision and conditions such as cataracts can make you more likely to have a fall. If you wear glasses, make sure to get your prescription updated if your vision changes.  Medicine review. Work with your health care provider to regularly review all of the medicines you are taking, including over-the-counter medicines. Ask your health care provider about any side effects that may make you more likely to have a fall. Tell your health care provider if any medicines that you take make you feel dizzy or sleepy.  Osteoporosis screening. Osteoporosis is a condition that causes the bones to get weaker. This can  make the bones weak and cause them to break more easily.  Blood pressure screening. Blood pressure changes and medicines to control blood pressure can make you feel dizzy.  Strength and balance checks. Your health care provider may recommend certain tests to check your strength and balance while standing, walking, or changing positions.  Foot health exam. Foot pain and numbness, as well as not wearing proper footwear, can make you more likely to have a fall.  Depression screening. You may be more likely to have a fall if you have a fear of falling, feel emotionally low, or feel unable to do activities that you used to do.  Alcohol use screening. Using too much alcohol can affect your balance and may make you more likely to have a fall. What actions can I take to lower my risk of falls? General instructions  Talk with your health care provider about your risks for falling. Tell your health care provider if: ? You fall. Be sure to tell your health care provider about all falls, even ones that seem minor. ? You feel dizzy, sleepy, or off-balance.  Take over-the-counter and prescription medicines only as told by your health care provider. These include any supplements.  Eat a healthy diet and maintain a healthy weight. A healthy diet includes low-fat dairy products, low-fat (lean) meats, and fiber from whole grains, beans, and lots of fruits and vegetables. Home safety  Remove any tripping hazards, such as rugs, cords, and clutter.  Install safety equipment such as  grab bars in bathrooms and safety rails on stairs.  Keep rooms and walkways well-lit. Activity   Follow a regular exercise program to stay fit. This will help you maintain your balance. Ask your health care provider what types of exercise are appropriate for you.  If you need a cane or walker, use it as recommended by your health care provider.  Wear supportive shoes that have nonskid soles. Lifestyle  Do not drink  alcohol if your health care provider tells you not to drink.  If you drink alcohol, limit how much you have: ? 0-1 drink a day for women. ? 0-2 drinks a day for men.  Be aware of how much alcohol is in your drink. In the [place] one drink equals one typical bottle of beer (12 oz), one-half glass of wine (5 oz), or one shot of hard liquor (1 oz).  Do not use any products that contain nicotine or tobacco, such as cigarettes and e-cigarettes. If you need help quitting, ask your health care provider. Summary  Having a healthy lifestyle and getting preventive care can help to protect your health and wellness after age 105.  Screening and testing are the best way to find a health problem early and help you avoid having a fall. Early diagnosis and treatment give you the best chance for managing medical conditions that are more common for people who are older than age 68.  Falls are a major cause of broken bones and head injuries in people who are older than age 6. Take precautions to prevent a fall at home.  Work with your health care provider to learn what changes you can make to improve your health and wellness and to prevent falls. This information is not intended to replace advice given to you by your health care provider. Make sure you discuss any questions you have with your health care provider. Document Revised: 12/26/2018 Document Reviewed: 07/18/2017 Elsevier Patient Education  2020 [employer].

## 2020-05-04 NOTE — Progress Notes (Addendum)
[facility] [place], [place], [place] [address] [phone]  Date:  05/05/2020   Name:  Sarah Rangel   DOB:  30-Oct-1955   MRN:  [id number]  PCP:  Darreld Mclean, MD    Chief Complaint: Annual Exam   History of Present Illness:  Sarah Rangel is a 64 y.o. very pleasant female patient who presents with the following:  Patient with history of hypertension, prediabetes Here today for physical exam  Last seen by myself about 1 year ago for same Her gynecologist is Dr. 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 Pre-diabetes 07/04/2016  . White coat hypertension 12/29/2015  . Status post total abdominal hysterectomy and bilateral salpingo-oophorectomy 11/02/2015  . Worsening headaches 12/05/2013  . Unspecified essential hypertension 11/06/2013  . Stress 11/06/2013    Past Medical History:  Diagnosis Date  . Anxiety   . History of cystocele   . History of shingles 2015   two occurances  . Hypertension   . Osteopenia 2020    Past Surgical History:  Procedure Laterality Date  . ABDOMINAL HYSTERECTOMY N/A 11/02/2015   Procedure: HYSTERECTOMY ABDOMINAL;  Surgeon: Nunzio Cobbs, MD;  Location: [facility];  Service: Gynecology;  Laterality: N/A;  4 hours  . ABDOMINAL SACROCOLPOPEXY N/A 11/02/2015   Procedure: ABDOMINO SACROCOLPOPEXY with Alyte mesh and Halban's Culdoplasty;  Surgeon: Nunzio Cobbs, MD;  Location: [facility];   Service: Gynecology;  Laterality: N/A;  . ANTERIOR AND POSTERIOR REPAIR N/A 11/02/2015   Procedure: POSTERIOR REPAIR (RECTOCELE) ;  Surgeon: Nunzio Cobbs, MD;  Location: [facility];  Service: Gynecology;  Laterality: N/A;  . BLADDER SUSPENSION N/A 11/02/2015   Procedure: TRANSVAGINAL TAPE (TVT) PROCEDURE exact mid-urethral sling;  Surgeon: Nunzio Cobbs, MD;  Location: [facility];  Service: Gynecology;  Laterality: N/A;  . CYSTOSCOPY N/A 11/02/2015   Procedure: CYSTOSCOPY;  Surgeon: Nunzio Cobbs, MD;  Location: [facility];  Service: Gynecology;  Laterality: N/A;  . SALPINGOOPHORECTOMY Bilateral 11/02/2015   Procedure: SALPINGO OOPHORECTOMY;  Surgeon: Nunzio Cobbs, MD;  Location: [facility];  Service: Gynecology;  Laterality: Bilateral;  . WISDOM TOOTH EXTRACTION     age 1    Social History   Tobacco Use  . Smoking status: Former Smoker    Types: Cigarettes  . Smokeless tobacco: Never Used  Vaping Use  . Vaping Use: Never used  Substance Use Topics  . Alcohol use: Yes    Alcohol/week: 2.0 standard drinks    Types: 2 Standard drinks or equivalent per week    Comment: occasionally 1 time monthly  . Drug use: No    Family History  Problem Relation Age of Onset  . Hypertension Father   . Brain cancer Mother   . Lymphoma Mother   . Colon cancer Neg Hx   . Pancreatic cancer Neg Hx   . Stomach cancer Neg Hx     No Known  Allergies  Medication list has been reviewed and updated.  Current Outpatient Medications on File Prior to Visit  Medication Sig Dispense Refill  . NIFEdipine (PROCARDIA-XL/NIFEDICAL-XL) 30 MG 24 hr tablet TAKE 1 TABLET BY MOUTH TWICE DAILY 180 tablet 0  . olmesartan (BENICAR) 40 MG tablet Take 1 tablet (40 mg total) by mouth daily. 90 tablet 0   No current facility-administered medications on file prior to visit.    Review of Systems:  As per HPI- otherwise negative.   Physical Examination: Vitals:   05/05/20 1412  BP:  140/78  Pulse: 87  Resp: 16  SpO2: 98%   Vitals:   05/05/20 1412  Weight: 191 lb (86.6 kg)  Height: 5\' 6"  (1.676 m)   Body mass index is 30.83 kg/m. Ideal Body Weight: Weight in (lb) to have BMI = 25: 154.6  GEN: no acute distress.  Overweight, looks well  HEENT: Atraumatic, Normocephalic.   Bilateral TM wnl, oropharynx normal.  PEERL,EOMI.   Ears and Nose: No external deformity. CV: RRR, No M/G/R. No JVD. No thrill. No extra heart sounds. PULM: CTA B, no wheezes, crackles, rhonchi. No retractions. No resp. distress. No accessory muscle use. ABD: S, NT, ND, +BS. No rebound. No HSM. EXTR: No c/c/e PSYCH: Normally interactive. Conversant.    Assessment and Plan: Physical exam  Essential hypertension - Plan: CBC, Comprehensive metabolic panel, NIFEdipine (PROCARDIA-XL/NIFEDICAL-XL) 30 MG 24 hr tablet, olmesartan (BENICAR) 40 MG tablet  Screening for hyperlipidemia - Plan: Lipid panel  Pre-diabetes - Plan: Hemoglobin A1c  Immunization due - Plan: Pneumococcal polysaccharide vaccine 23-valent greater than or equal to 2yo subcutaneous/IM  Screening for thyroid disorder - Plan: TSH  Acute swimmer's ear, unspecified laterality - Plan: neomycin-polymyxin-hydrocortisone (CORTISPORIN) OTIC solution  Pt here today for a CPE Overall doing well She swims for exercise and would like ear drops to keep on hand Will plan further follow- up pending labs. Encouraged her to continue exercise and heathy diet Former smoker   This visit occurred during the SARS-CoV-2 public health emergency.  Safety protocols were in place, including screening questions prior to the visit, additional usage of staff PPE, and extensive cleaning of exam room while observing appropriate contact time as indicated for disinfecting solutions.    Signed Lamar Blinks, MD  Received her labs 8/19- message to pt  Results for orders placed or performed in visit on 05/05/20  CBC  Result Value Ref Range   WBC  5.4 4.0 - 10.5 K/uL   RBC 4.10 3.87 - 5.11 Mil/uL   Platelets 184.0 150 - 400 K/uL   Hemoglobin 13.4 12.0 - 15.0 g/dL   HCT 38.9 36 - 46 %   MCV 94.9 78.0 - 100.0 fl   MCHC 34.4 30.0 - 36.0 g/dL   RDW 12.8 11.5 - 15.5 %  Comprehensive metabolic panel  Result Value Ref Range   Sodium 135 135 - 145 mEq/L   Potassium 4.0 3.5 - 5.1 mEq/L   Chloride 100 96 - 112 mEq/L   CO2 25 19 - 32 mEq/L   Glucose, Bld 107 (H) 70 - 99 mg/dL   BUN 23 6 - 23 mg/dL   Creatinine, Ser 1.03 0.40 - 1.20 mg/dL   Total Bilirubin 0.7 0.2 - 1.2 mg/dL   Alkaline Phosphatase 53 39 - 117 U/L   AST 14 0 - 37 U/L   ALT 19 0 - 35 U/L   Total Protein 7.2 6.0 - 8.3 g/dL   Albumin 4.9 3.5 - 5.2 g/dL  GFR 53.58 (L) >60.00 mL/min   Calcium 9.7 8.4 - 10.5 mg/dL  Hemoglobin A1c  Result Value Ref Range   Hgb A1c MFr Bld 6.6 (H) 4.6 - 6.5 %  Lipid panel  Result Value Ref Range   Cholesterol 190 0 - 200 mg/dL   Triglycerides 131.0 0 - 149 mg/dL   HDL 54.20 >39.00 mg/dL   VLDL 26.2 0.0 - 40.0 mg/dL   LDL Cholesterol 110 (H) 0 - 99 mg/dL   Total CHOL/HDL Ratio 4    NonHDL 136.02   TSH  Result Value Ref Range   TSH 2.22 0.35 - 4.50 uIU/mL     Blood counts are normal Metabolic profile normal except for slightly low kidney function-  we will continue to monitor Your aA1c- average blood sugar- is in the diabetes range this time. We don't diagnose diabetes until you have 2 A1c levels over 6.5, we will monitor  Cholesterol ok Thyroid in range If you like, we might consider starting an oral medication to control your blood sugar- specifically metformin.  Let me know if this is something you would like to try  In any case let's visit in 6 months

## 2020-05-05 ENCOUNTER — Ambulatory Visit (INDEPENDENT_AMBULATORY_CARE_PROVIDER_SITE_OTHER): Payer: 59 | Admitting: Family Medicine

## 2020-05-05 ENCOUNTER — Encounter: Payer: Self-pay | Admitting: Family Medicine

## 2020-05-05 ENCOUNTER — Other Ambulatory Visit: Payer: Self-pay | Admitting: Family Medicine

## 2020-05-05 ENCOUNTER — Other Ambulatory Visit: Payer: Self-pay

## 2020-05-05 VITALS — BP 140/78 | HR 87 | Resp 16 | Ht 66.0 in | Wt 191.0 lb

## 2020-05-05 DIAGNOSIS — R7303 Prediabetes: Secondary | ICD-10-CM | POA: Diagnosis not present

## 2020-05-05 DIAGNOSIS — I10 Essential (primary) hypertension: Secondary | ICD-10-CM

## 2020-05-05 DIAGNOSIS — Z23 Encounter for immunization: Secondary | ICD-10-CM

## 2020-05-05 DIAGNOSIS — Z0000 Encounter for general adult medical examination without abnormal findings: Secondary | ICD-10-CM | POA: Diagnosis not present

## 2020-05-05 DIAGNOSIS — H60339 Swimmer's ear, unspecified ear: Secondary | ICD-10-CM

## 2020-05-05 DIAGNOSIS — Z13220 Encounter for screening for lipoid disorders: Secondary | ICD-10-CM

## 2020-05-05 DIAGNOSIS — Z1329 Encounter for screening for other suspected endocrine disorder: Secondary | ICD-10-CM

## 2020-05-05 MED ORDER — OLMESARTAN MEDOXOMIL 40 MG PO TABS: 40.0000 mg | ORAL_TABLET | Freq: Every day | ORAL | 3 refills | Status: DC

## 2020-05-05 MED ORDER — NIFEDIPINE ER OSMOTIC RELEASE 30 MG PO TB24: ORAL_TABLET | ORAL | 3 refills | Status: DC

## 2020-05-05 MED ORDER — NEOMYCIN-POLYMYXIN-HC 3.5-10000-1 OT SOLN: 3.0000 [drp] | Freq: Four times a day (QID) | OTIC | 2 refills | Status: DC

## 2020-05-05 MED FILL — NEO/POLYMYXIN/HC EAR SOLN: 3.5-10000-1 | 8 days supply | Qty: 10 | Fill #0

## 2020-05-06 ENCOUNTER — Encounter: Payer: Self-pay | Admitting: Family Medicine

## 2020-05-06 LAB — LIPID PANEL
Cholesterol: 190 mg/dL (ref 0–200)
HDL: 54.2 mg/dL (ref >39.00)
LDL Cholesterol: 110 mg/dL — ABNORMAL HIGH (ref 0–99)
NonHDL: 136.02
Total CHOL/HDL Ratio: 4
Triglycerides: 131 mg/dL (ref 0.0–149.0)
VLDL: 26.2 mg/dL (ref 0.0–40.0)

## 2020-05-06 LAB — COMPREHENSIVE METABOLIC PANEL
ALT: 19 U/L (ref 0–35)
AST: 14 U/L (ref 0–37)
Albumin: 4.9 g/dL (ref 3.5–5.2)
Alkaline Phosphatase: 53 U/L (ref 39–117)
BUN: 23 mg/dL (ref 6–23)
CO2: 25 mEq/L (ref 19–32)
Calcium: 9.7 mg/dL (ref 8.4–10.5)
Chloride: 100 mEq/L (ref 96–112)
Creatinine, Ser: 1.03 mg/dL (ref 0.40–1.20)
GFR: 53.58 mL/min — ABNORMAL LOW (ref >60.00)
Glucose, Bld: 107 mg/dL — ABNORMAL HIGH (ref 70–99)
Potassium: 4 mEq/L (ref 3.5–5.1)
Sodium: 135 mEq/L (ref 135–145)
Total Bilirubin: 0.7 mg/dL (ref 0.2–1.2)
Total Protein: 7.2 g/dL (ref 6.0–8.3)

## 2020-05-06 LAB — CBC
HCT: 38.9 % (ref 36.0–46.0)
Hemoglobin: 13.4 g/dL (ref 12.0–15.0)
MCHC: 34.4 g/dL (ref 30.0–36.0)
MCV: 94.9 fl (ref 78.0–100.0)
Platelets: 184 10*3/uL (ref 150.0–400.0)
RBC: 4.1 Mil/uL (ref 3.87–5.11)
RDW: 12.8 % (ref 11.5–15.5)
WBC: 5.4 10*3/uL (ref 4.0–10.5)

## 2020-05-06 LAB — HEMOGLOBIN A1C: Hgb A1c MFr Bld: 6.6 % — ABNORMAL HIGH (ref 4.6–6.5)

## 2020-05-06 LAB — TSH: TSH: 2.22 u[IU]/mL (ref 0.35–4.50)

## 2020-05-12 ENCOUNTER — Other Ambulatory Visit: Payer: Self-pay | Admitting: Family Medicine

## 2020-05-12 DIAGNOSIS — Z1231 Encounter for screening mammogram for malignant neoplasm of breast: Secondary | ICD-10-CM

## 2020-06-07 MED FILL — OLMESARTAN MEDOXOMIL 40 MG: 40 | 90 days supply | Qty: 90 | Fill #0

## 2020-06-30 ENCOUNTER — Other Ambulatory Visit: Payer: Self-pay

## 2020-06-30 ENCOUNTER — Ambulatory Visit
Admission: RE | Admit: 2020-06-30 | Discharge: 2020-06-30 | Disposition: A | Discharge status: Home / Self Care | Payer: 59 | Source: Ambulatory Visit | Attending: Family Medicine | Admitting: Family Medicine

## 2020-06-30 DIAGNOSIS — Z1231 Encounter for screening mammogram for malignant neoplasm of breast: Secondary | ICD-10-CM | POA: Diagnosis not present

## 2020-07-12 MED FILL — NIFEdipine ER OSMOTIC RELEA: 30 | 90 days supply | Qty: 180 | Fill #0

## 2020-07-20 DIAGNOSIS — H6981 Other specified disorders of Eustachian tube, right ear: Secondary | ICD-10-CM | POA: Diagnosis not present

## 2020-07-20 DIAGNOSIS — J358 Other chronic diseases of tonsils and adenoids: Secondary | ICD-10-CM | POA: Diagnosis not present

## 2020-09-08 MED FILL — OLMESARTAN MEDOXOMIL 40 MG: 40 | 90 days supply | Qty: 90 | Fill #1

## 2020-10-19 MED FILL — NIFEdipine ER OSMOTIC RELEA: 30 | 90 days supply | Qty: 180 | Fill #1

## 2020-12-27 ENCOUNTER — Other Ambulatory Visit (HOSPITAL_COMMUNITY): Payer: Self-pay

## 2020-12-27 MED FILL — Olmesartan Medoxomil Tab 40 MG: ORAL | 90 days supply | Qty: 90 | Fill #0 | Status: AC

## 2021-01-27 ENCOUNTER — Other Ambulatory Visit (HOSPITAL_COMMUNITY): Payer: Self-pay

## 2021-01-27 ENCOUNTER — Encounter: Payer: Self-pay | Admitting: Family Medicine

## 2021-01-27 DIAGNOSIS — I10 Essential (primary) hypertension: Secondary | ICD-10-CM

## 2021-01-27 MED ORDER — AMLODIPINE BESYLATE 5 MG PO TABS
5.0000 mg | ORAL_TABLET | Freq: Every day | ORAL | 3 refills | Status: DC
  Filled 2021-01-27: qty 90, 90d supply, fill #0
  Filled 2021-04-24: qty 90, 90d supply, fill #1

## 2021-04-04 MED FILL — Olmesartan Medoxomil Tab 40 MG: ORAL | 90 days supply | Qty: 90 | Fill #1 | Status: AC

## 2021-04-05 ENCOUNTER — Other Ambulatory Visit (HOSPITAL_COMMUNITY): Payer: Self-pay

## 2021-04-13 ENCOUNTER — Other Ambulatory Visit (HOSPITAL_COMMUNITY): Payer: Self-pay

## 2021-04-13 MED ORDER — CARESTART COVID-19 HOME TEST VI KIT
PACK | 0 refills | Status: DC
  Filled 2021-04-13: qty 4, 4d supply, fill #0

## 2021-04-25 ENCOUNTER — Other Ambulatory Visit (HOSPITAL_COMMUNITY): Payer: Self-pay

## 2021-04-28 ENCOUNTER — Encounter: Payer: Self-pay | Admitting: Family Medicine

## 2021-05-08 NOTE — Progress Notes (Addendum)
[facility] [place], [place], [place] [address] [phone]  Date:  05/11/2021   Name:  Sarah Rangel   DOB:  July 03, 1956   MRN:  [id number]  PCP:  Darreld Mclean, MD    Chief Complaint: Annual Exam (Rectal itching, possible hemorrhoids? )   History of Present Illness:  Sarah Rangel is a 65 y.o. 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  She also notes some vulvar itching on occasion, she will sometimes use an over-the-counter cortisone cream    Patient Active Problem List   Diagnosis Date Noted   Pre-diabetes 07/04/2016   White coat hypertension 12/29/2015   Status post total abdominal hysterectomy and bilateral salpingo-oophorectomy 11/02/2015   Worsening headaches 12/05/2013   Unspecified essential hypertension 11/06/2013   Stress 11/06/2013    Past Medical History:  Diagnosis Date   Anxiety    History of cystocele    History of shingles 2015   two occurances   Hypertension    Osteopenia 2020    Past Surgical History:  Procedure Laterality Date   ABDOMINAL HYSTERECTOMY N/A 11/02/2015   Procedure: HYSTERECTOMY ABDOMINAL;  Surgeon: Nunzio Cobbs, MD;  Location: [facility];   Service: Gynecology;  Laterality: N/A;  4 hours   ABDOMINAL SACROCOLPOPEXY N/A 11/02/2015   Procedure: ABDOMINO SACROCOLPOPEXY with Alyte mesh and Halban's Culdoplasty;  Surgeon: Nunzio Cobbs, MD;  Location: [facility];  Service: Gynecology;  Laterality: N/A;   ANTERIOR AND POSTERIOR REPAIR N/A 11/02/2015   Procedure: POSTERIOR REPAIR (RECTOCELE) ;  Surgeon: Nunzio Cobbs, MD;  Location: [facility];  Service: Gynecology;  Laterality: N/A;   BLADDER SUSPENSION N/A 11/02/2015   Procedure: TRANSVAGINAL TAPE (TVT) PROCEDURE exact mid-urethral sling;  Surgeon: Nunzio Cobbs, MD;  Location: [facility];  Service: Gynecology;  Laterality: N/A;   CYSTOSCOPY N/A 11/02/2015   Procedure: CYSTOSCOPY;  Surgeon: Nunzio Cobbs, MD;  Location: [facility];  Service: Gynecology;  Laterality: N/A;   SALPINGOOPHORECTOMY Bilateral 11/02/2015   Procedure: SALPINGO OOPHORECTOMY;  Surgeon: Nunzio Cobbs, MD;  Location: [facility];  Service: Gynecology;  Laterality: Bilateral;   WISDOM TOOTH EXTRACTION     age 42    Social History   Tobacco Use   Smoking status: Former    Types: Cigarettes   Smokeless tobacco: Never  Vaping Use   Vaping Use: Never used  Substance Use Topics   Alcohol use: Yes    Alcohol/week: 2.0 standard drinks    Types: 2 Standard drinks or equivalent per week    Comment: occasionally 1 time monthly   Drug use: No    Family History  Problem Relation Age of  Onset   Hypertension Father    Brain cancer Mother    Lymphoma Mother    Colon cancer Neg Hx    Pancreatic cancer Neg Hx    Stomach cancer Neg Hx     Not on File  Medication list has been reviewed and updated.  Current Outpatient Medications on File Prior to Visit  Medication Sig Dispense Refill   amLODipine (NORVASC) 5 MG tablet Take 1 tablet (5 mg total) by mouth daily. 90 tablet 3   olmesartan (BENICAR) 40 MG tablet TAKE 1 TABLET BY MOUTH ONCE DAILY 90 tablet 3   No current  facility-administered medications on file prior to visit.    Review of Systems:  As per HPI- otherwise negative.   Physical Examination: Vitals:   05/11/21 1353  BP: 136/82  Pulse: 94  Resp: 18  Temp: 98.2 F (36.8 C)  SpO2: 96%   Vitals:   05/11/21 1353  Weight: 190 lb (86.2 kg)  Height: '5\' 6"'$  (1.676 m)   Body mass index is 30.67 kg/m. Ideal Body Weight: Weight in (lb) to have BMI = 25: 154.6  GEN: no acute distress.  Overweight, looks well HEENT: Atraumatic, Normocephalic.  Bilateral TM wnl, oropharynx normal.  PEERL,EOMI.   Ears and Nose: No external deformity. CV: RRR, No M/G/R. No JVD. No thrill. No extra heart sounds. PULM: CTA B, no wheezes, crackles, rhonchi. No retractions. No resp. distress. No accessory muscle use. ABD: S, NT, ND, +BS. No rebound. No HSM. EXTR: No c/c/e PSYCH: Normally interactive. Conversant.  No external hemorrhoids are noted.  External vulva is normal   Assessment and Plan: Physical exam  Essential hypertension - Plan: CBC, Comprehensive metabolic panel, olmesartan (BENICAR) 40 MG tablet, amLODipine (NORVASC) 5 MG tablet  Screening for hyperlipidemia - Plan: Lipid panel  Screening for thyroid disorder - Plan: TSH  Pre-diabetes - Plan: Hemoglobin A1c  Fatigue, unspecified type - Plan: TSH, VITAMIN D 25 Hydroxy (Vit-D Deficiency, Fractures)  Estrogen deficiency - Plan: DG Bone Density  Encounter for screening mammogram for breast cancer - Plan: MM 3D SCREEN BREAST BILATERAL  Physical exam today.  Encouraged healthy diet and exercise routine Will plan further follow- up pending labs. Discussed appropriate screening and immunizations.  Ordered mammogram and bone density Reassured about her anal itching, which hazel is certainly fine to use   This visit occurred during the SARS-CoV-2 public health emergency.  Safety protocols were in place, including screening questions prior to the visit, additional usage of staff PPE, and  extensive cleaning of exam room while observing appropriate contact time as indicated for disinfecting solutions.   Signed Lamar Blinks, MD  Addendum 8/25, received her labs as below.  Message to patient  Results for orders placed or performed in visit on 05/11/21  CBC  Result Value Ref Range   WBC 5.7 4.0 - 10.5 K/uL   RBC 3.99 3.87 - 5.11 Mil/uL   Platelets 164.0 150.0 - 400.0 K/uL   Hemoglobin 12.8 12.0 - 15.0 g/dL   HCT 37.2 36.0 - 46.0 %   MCV 93.1 78.0 - 100.0 fl   MCHC 34.5 30.0 - 36.0 g/dL   RDW 12.7 11.5 - 15.5 %  Comprehensive metabolic panel  Result Value Ref Range   Sodium 137 135 - 145 mEq/L   Potassium 4.2 3.5 - 5.1 mEq/L   Chloride 98 96 - 112 mEq/L   CO2 27 19 - 32 mEq/L   Glucose, Bld 113 (H) 70 - 99 mg/dL  BUN 16 6 - 23 mg/dL   Creatinine, Ser 0.95 0.40 - 1.20 mg/dL   Total Bilirubin 0.6 0.2 - 1.2 mg/dL   Alkaline Phosphatase 51 39 - 117 U/L   AST 14 0 - 37 U/L   ALT 15 0 - 35 U/L   Total Protein 6.8 6.0 - 8.3 g/dL   Albumin 4.6 3.5 - 5.2 g/dL   GFR 62.13 >60.00 mL/min   Calcium 9.9 8.4 - 10.5 mg/dL  Hemoglobin A1c  Result Value Ref Range   Hgb A1c MFr Bld 6.9 (H) 4.6 - 6.5 %  Lipid panel  Result Value Ref Range   Cholesterol 179 0 - 200 mg/dL   Triglycerides 70.0 0.0 - 149.0 mg/dL   HDL 55.70 >39.00 mg/dL   VLDL 14.0 0.0 - 40.0 mg/dL   LDL Cholesterol 110 (H) 0 - 99 mg/dL   Total CHOL/HDL Ratio 3    NonHDL 123.76   TSH  Result Value Ref Range   TSH 1.81 0.35 - 5.50 uIU/mL  VITAMIN D 25 Hydroxy (Vit-D Deficiency, Fractures)  Result Value Ref Range   VITD 29.89 (L) 30.00 - 100.00 ng/mL

## 2021-05-08 NOTE — Patient Instructions (Addendum)
It was great to see you again today, I will be in touch with your labs are soon as possible  I ordered a bone density and mammogram to be done in October  Take care!

## 2021-05-11 ENCOUNTER — Encounter: Payer: Self-pay | Admitting: Family Medicine

## 2021-05-11 ENCOUNTER — Other Ambulatory Visit (HOSPITAL_COMMUNITY): Payer: Self-pay

## 2021-05-11 ENCOUNTER — Other Ambulatory Visit: Payer: Self-pay

## 2021-05-11 ENCOUNTER — Ambulatory Visit (INDEPENDENT_AMBULATORY_CARE_PROVIDER_SITE_OTHER): Payer: 59 | Admitting: Family Medicine

## 2021-05-11 VITALS — BP 136/82 | HR 94 | Temp 98.2°F | Resp 18 | Ht 66.0 in | Wt 190.0 lb

## 2021-05-11 DIAGNOSIS — E2839 Other primary ovarian failure: Secondary | ICD-10-CM

## 2021-05-11 DIAGNOSIS — Z1329 Encounter for screening for other suspected endocrine disorder: Secondary | ICD-10-CM

## 2021-05-11 DIAGNOSIS — Z1231 Encounter for screening mammogram for malignant neoplasm of breast: Secondary | ICD-10-CM | POA: Diagnosis not present

## 2021-05-11 DIAGNOSIS — R5383 Other fatigue: Secondary | ICD-10-CM | POA: Diagnosis not present

## 2021-05-11 DIAGNOSIS — Z13220 Encounter for screening for lipoid disorders: Secondary | ICD-10-CM | POA: Diagnosis not present

## 2021-05-11 DIAGNOSIS — I10 Essential (primary) hypertension: Secondary | ICD-10-CM

## 2021-05-11 DIAGNOSIS — E119 Type 2 diabetes mellitus without complications: Secondary | ICD-10-CM | POA: Diagnosis not present

## 2021-05-11 DIAGNOSIS — Z0000 Encounter for general adult medical examination without abnormal findings: Secondary | ICD-10-CM

## 2021-05-11 DIAGNOSIS — R7303 Prediabetes: Secondary | ICD-10-CM | POA: Diagnosis not present

## 2021-05-11 MED ORDER — OLMESARTAN MEDOXOMIL 40 MG PO TABS
ORAL_TABLET | Freq: Every day | ORAL | 3 refills | Status: DC
  Filled 2021-05-11: qty 90, fill #0
  Filled 2021-07-11: qty 90, 90d supply, fill #0
  Filled 2021-10-05: qty 90, 90d supply, fill #1
  Filled 2022-01-09: qty 90, 90d supply, fill #2
  Filled 2022-04-04: qty 90, 90d supply, fill #3

## 2021-05-11 MED ORDER — AMLODIPINE BESYLATE 5 MG PO TABS
5.0000 mg | ORAL_TABLET | Freq: Every day | ORAL | 3 refills | Status: DC
  Filled 2021-05-11 – 2021-07-25 (×2): qty 90, 90d supply, fill #0
  Filled 2021-10-19: qty 90, 90d supply, fill #1
  Filled 2022-01-09: qty 90, 90d supply, fill #2
  Filled 2022-04-04: qty 90, 90d supply, fill #3

## 2021-05-12 ENCOUNTER — Encounter: Payer: Self-pay | Admitting: Family Medicine

## 2021-05-12 LAB — LIPID PANEL
Cholesterol: 179 mg/dL (ref 0–200)
HDL: 55.7 mg/dL (ref >39.00)
LDL Cholesterol: 110 mg/dL — ABNORMAL HIGH (ref 0–99)
NonHDL: 123.76
Total CHOL/HDL Ratio: 3
Triglycerides: 70 mg/dL (ref 0.0–149.0)
VLDL: 14 mg/dL (ref 0.0–40.0)

## 2021-05-12 LAB — COMPREHENSIVE METABOLIC PANEL
ALT: 15 U/L (ref 0–35)
AST: 14 U/L (ref 0–37)
Albumin: 4.6 g/dL (ref 3.5–5.2)
Alkaline Phosphatase: 51 U/L (ref 39–117)
BUN: 16 mg/dL (ref 6–23)
CO2: 27 mEq/L (ref 19–32)
Calcium: 9.9 mg/dL (ref 8.4–10.5)
Chloride: 98 mEq/L (ref 96–112)
Creatinine, Ser: 0.95 mg/dL (ref 0.40–1.20)
GFR: 62.13 mL/min (ref >60.00)
Glucose, Bld: 113 mg/dL — ABNORMAL HIGH (ref 70–99)
Potassium: 4.2 mEq/L (ref 3.5–5.1)
Sodium: 137 mEq/L (ref 135–145)
Total Bilirubin: 0.6 mg/dL (ref 0.2–1.2)
Total Protein: 6.8 g/dL (ref 6.0–8.3)

## 2021-05-12 LAB — CBC
HCT: 37.2 % (ref 36.0–46.0)
Hemoglobin: 12.8 g/dL (ref 12.0–15.0)
MCHC: 34.5 g/dL (ref 30.0–36.0)
MCV: 93.1 fl (ref 78.0–100.0)
Platelets: 164 10*3/uL (ref 150.0–400.0)
RBC: 3.99 Mil/uL (ref 3.87–5.11)
RDW: 12.7 % (ref 11.5–15.5)
WBC: 5.7 10*3/uL (ref 4.0–10.5)

## 2021-05-12 LAB — HEMOGLOBIN A1C: Hgb A1c MFr Bld: 6.9 % — ABNORMAL HIGH (ref 4.6–6.5)

## 2021-05-12 LAB — TSH: TSH: 1.81 u[IU]/mL (ref 0.35–5.50)

## 2021-05-12 LAB — VITAMIN D 25 HYDROXY (VIT D DEFICIENCY, FRACTURES): VITD: 29.89 ng/mL — ABNORMAL LOW (ref 30.00–100.00)

## 2021-06-14 DIAGNOSIS — H524 Presbyopia: Secondary | ICD-10-CM | POA: Diagnosis not present

## 2021-06-14 DIAGNOSIS — H5203 Hypermetropia, bilateral: Secondary | ICD-10-CM | POA: Diagnosis not present

## 2021-06-14 DIAGNOSIS — H52222 Regular astigmatism, left eye: Secondary | ICD-10-CM | POA: Diagnosis not present

## 2021-06-14 DIAGNOSIS — H25813 Combined forms of age-related cataract, bilateral: Secondary | ICD-10-CM | POA: Diagnosis not present

## 2021-07-05 ENCOUNTER — Other Ambulatory Visit: Payer: Self-pay

## 2021-07-05 ENCOUNTER — Ambulatory Visit
Admission: RE | Admit: 2021-07-05 | Discharge: 2021-07-05 | Disposition: A | Discharge status: Home / Self Care | Payer: 59 | Source: Ambulatory Visit | Attending: Family Medicine | Admitting: Family Medicine

## 2021-07-05 DIAGNOSIS — Z1231 Encounter for screening mammogram for malignant neoplasm of breast: Secondary | ICD-10-CM | POA: Diagnosis not present

## 2021-07-11 ENCOUNTER — Other Ambulatory Visit (HOSPITAL_COMMUNITY): Payer: Self-pay

## 2021-07-25 ENCOUNTER — Other Ambulatory Visit (HOSPITAL_COMMUNITY): Payer: Self-pay

## 2021-07-27 ENCOUNTER — Other Ambulatory Visit: Payer: Self-pay

## 2021-07-27 ENCOUNTER — Encounter: Payer: Self-pay | Admitting: Family Medicine

## 2021-07-27 ENCOUNTER — Ambulatory Visit
Admission: RE | Admit: 2021-07-27 | Discharge: 2021-07-27 | Disposition: A | Discharge status: Home / Self Care | Payer: 59 | Source: Ambulatory Visit | Attending: Family Medicine | Admitting: Family Medicine

## 2021-07-27 DIAGNOSIS — E2839 Other primary ovarian failure: Secondary | ICD-10-CM

## 2021-07-27 DIAGNOSIS — M8589 Other specified disorders of bone density and structure, multiple sites: Secondary | ICD-10-CM | POA: Diagnosis not present

## 2021-07-27 DIAGNOSIS — Z780 Asymptomatic menopausal state: Secondary | ICD-10-CM | POA: Diagnosis not present

## 2021-10-05 ENCOUNTER — Other Ambulatory Visit (HOSPITAL_COMMUNITY): Payer: Self-pay

## 2021-10-19 ENCOUNTER — Other Ambulatory Visit (HOSPITAL_COMMUNITY): Payer: Self-pay

## 2021-11-14 ENCOUNTER — Other Ambulatory Visit (HOSPITAL_COMMUNITY): Payer: Self-pay

## 2021-12-27 ENCOUNTER — Ambulatory Visit (HOSPITAL_BASED_OUTPATIENT_CLINIC_OR_DEPARTMENT_OTHER)
Admission: RE | Admit: 2021-12-27 | Discharge: 2021-12-27 | Disposition: A | Discharge status: Home / Self Care | Payer: 59 | Source: Ambulatory Visit | Attending: Medical | Admitting: Medical

## 2021-12-27 ENCOUNTER — Encounter: Payer: Self-pay | Admitting: Medical

## 2021-12-27 ENCOUNTER — Ambulatory Visit: Payer: 59 | Admitting: Medical

## 2021-12-27 VITALS — BP 140/62 | HR 70 | Temp 97.5°F | Resp 18 | Ht 66.0 in | Wt 197.2 lb

## 2021-12-27 DIAGNOSIS — G44319 Acute post-traumatic headache, not intractable: Secondary | ICD-10-CM

## 2021-12-27 DIAGNOSIS — R2689 Other abnormalities of gait and mobility: Secondary | ICD-10-CM

## 2021-12-27 DIAGNOSIS — I10 Essential (primary) hypertension: Secondary | ICD-10-CM

## 2021-12-27 DIAGNOSIS — R42 Dizziness and giddiness: Secondary | ICD-10-CM | POA: Diagnosis not present

## 2021-12-27 DIAGNOSIS — S0990XA Unspecified injury of head, initial encounter: Secondary | ICD-10-CM | POA: Insufficient documentation

## 2021-12-27 DIAGNOSIS — R519 Headache, unspecified: Secondary | ICD-10-CM | POA: Diagnosis not present

## 2021-12-27 NOTE — Progress Notes (Signed)
? ?Subjective:  ? ? Patient ID: Sarah Rangel, female    DOB: [birth year], 66 y.o.   MRN: [id number] ? ?HPI ? ?Pt in for evaluation. Pt states she woke up Sunday. She was walking to use bathroom and her foot got caught in her pajama pants that are to long. She lost her balance and fell  forward against wall. She describes sliding down the wall. No loc. She felt small goose egg size bump that morning. Next day then bump was not as large. Area is mild tender now. Pt did not have syncope.  ? ?She played ice pack to area night of fall. Faint minimal ha at best. States not having dizziness but on discussion describes feeling lslight off balance at times. no nausea, no vomiting and no vision changes. ? ?Not on any blood thinners. ? ? ?Pt has severe white coat htn. Earlier today bp was 160/70. Often bp 130/60 but she thinks yesterday higher reading do to anxiety regarding fall and head trauma. ? ?Review of Systems  ?Constitutional:  Negative for chills, fatigue and fever.  ?Respiratory:  Negative for cough, chest tightness and wheezing.   ?Cardiovascular:  Negative for chest pain and palpitations.  ?Gastrointestinal:  Negative for abdominal pain, constipation, nausea and vomiting.  ?Musculoskeletal:  Negative for back pain.  ?Skin:  Negative for rash.  ?Neurological:  Positive for dizziness and headaches. Negative for syncope, speech difficulty, weakness and numbness.  ?     See hpi for clarification.  ?Hematological:  Negative for adenopathy. Does not bruise/bleed easily.  ?Psychiatric/Behavioral:  Negative for behavioral problems and dysphoric mood. The patient is nervous/anxious.   ? ? ?Past Medical History:  ?Diagnosis Date  ? Anxiety   ? History of cystocele   ? History of shingles 2015  ? two occurances  ? Hypertension   ? Osteopenia 2020  ? ?  ?Social History  ? ?Socioeconomic History  ? Marital status: Married  ?  Spouse name: Not on file  ? Number of children: Not on file  ? Years of education: Not on file   ? Highest education level: Not on file  ?Occupational History  ? Not on file  ?Tobacco Use  ? Smoking status: Former  ?  Types: Cigarettes  ? Smokeless tobacco: Never  ?Vaping Use  ? Vaping Use: Never used  ?Substance and Sexual Activity  ? Alcohol use: Yes  ?  Alcohol/week: 2.0 standard drinks  ?  Types: 2 Standard drinks or equivalent per week  ?  Comment: occasionally 1 time monthly  ? Drug use: No  ? Sexual activity: Yes  ?  Partners: Male  ?  Birth control/protection: Other-see comments, Post-menopausal, Surgical  ?  Comment: vasectomy/TAH/BSO  ?Other Topics Concern  ? Not on file  ?Social History Narrative  ? Not on file  ? ?Social Determinants of Health  ? ?Financial Resource Strain: Not on file  ?Food Insecurity: Not on file  ?Transportation Needs: Not on file  ?Physical Activity: Not on file  ?Stress: Not on file  ?Social Connections: Not on file  ?Intimate Partner Violence: Not on file  ? ? ?Past Surgical History:  ?Procedure Laterality Date  ? ABDOMINAL HYSTERECTOMY N/A 11/02/2015  ? Procedure: HYSTERECTOMY ABDOMINAL;  Surgeon: Nunzio Cobbs, MD;  Location: [facility];  Service: Gynecology;  Laterality: N/A;  4 hours  ? ABDOMINAL SACROCOLPOPEXY N/A 11/02/2015  ? Procedure: ABDOMINO SACROCOLPOPEXY with Alyte mesh and Halban's Culdoplasty;  Surgeon: Nunzio Cobbs,  MD;  Location: [facility];  Service: Gynecology;  Laterality: N/A;  ? ANTERIOR AND POSTERIOR REPAIR N/A 11/02/2015  ? Procedure: POSTERIOR REPAIR (RECTOCELE) ;  Surgeon: Nunzio Cobbs, MD;  Location: [facility];  Service: Gynecology;  Laterality: N/A;  ? BLADDER SUSPENSION N/A 11/02/2015  ? Procedure: TRANSVAGINAL TAPE (TVT) PROCEDURE exact mid-urethral sling;  Surgeon: Nunzio Cobbs, MD;  Location: [facility];  Service: Gynecology;  Laterality: N/A;  ? CYSTOSCOPY N/A 11/02/2015  ? Procedure: CYSTOSCOPY;  Surgeon: Nunzio Cobbs, MD;  Location: [facility];  Service: Gynecology;  Laterality: N/A;  ? SALPINGOOPHORECTOMY  Bilateral 11/02/2015  ? Procedure: SALPINGO OOPHORECTOMY;  Surgeon: Nunzio Cobbs, MD;  Location: [facility];  Service: Gynecology;  Laterality: Bilateral;  ? WISDOM TOOTH EXTRACTION    ? age 45  ? ? ?Family History  ?Problem Relation Age of Onset  ? Hypertension Father   ? Brain cancer Mother   ? Lymphoma Mother   ? Colon cancer Neg Hx   ? Pancreatic cancer Neg Hx   ? Stomach cancer Neg Hx   ? ? ?Not on File ? ?Current Outpatient Medications on File Prior to Visit  ?Medication Sig Dispense Refill  ? amLODipine (NORVASC) 5 MG tablet Take 1 tablet (5 mg total) by mouth daily. 90 tablet 3  ? olmesartan (BENICAR) 40 MG tablet TAKE 1 TABLET BY MOUTH ONCE DAILY 90 tablet 3  ? ?No current facility-administered medications on file prior to visit.  ? ? ?BP (!) 190/90   Pulse 70   Temp (!) 97.5 ?F (36.4 ?C)   Resp 18   Ht '5\' 6"'$  (1.676 m)   Wt 197 lb 3.2 oz (89.4 kg)   LMP 09/18/2008 (Approximate)   SpO2 100%   BMI 31.83 kg/m?  ?  ?   ?Objective:  ? Physical Exam ? ?General ?Mental Status- Alert. General Appearance- Not in acute distress.  ? ?Skin ?General: Color- Normal Color. Moisture- Normal Moisture. ? ?Neck ?Carotid Arteries- Normal color. Moisture- Normal Moisture. No carotid bruits. No JVD. ? ?Chest and Lung Exam ?Auscultation: ?Breath Sounds:-Normal. ? ?Cardiovascular ?Auscultation:Rythm- Regular. ?Murmurs & Other Heart Sounds:Auscultation of the heart reveals- No Murmurs. ? ?Neurologic ?Cranial Nerve exam:- CN III-XII intact(No nystagmus), symmetric smile. ?Drift Test:- No drift. ?Romberg Exam:- Negative.  ?Heal to Toe Gait exam:-Normal. ?Finger to Nose:- Normal/Intact ?Strength:- 5/5 equal and symmetric strength both upper and lower extremities.  ? ?Head- on palpation. No obvious trauma. Put slight faint temporal area tenderness on palpation. No bruising. ?   ?Assessment & Plan:  ? ?Patient Instructions  ?Recent fall Sunday morning with mild trauma to left temporal area.  No loss of consciousness  during fall and no syncope described before fall.  However you do report mild headache and intermittently off balance since event. For caution sake I decided to get CT of head without contrast.  Sent message to our referral staff to get prior authorization. ? ?Hypertension-blood pressure very high presently but you have normal neurologic exam.  On review history of whitecoat hypertension and I think your anxiety over the fall and head trauma causing BP increase as well.  Blood pressure earlier today was 160/70 and often times at home you have readings of 130/70 range.  Presently continue current BP medications.  Check blood pressure later today and tomorrow morning.  Please please update me please update me/send me a message via MyChart message via MyChart tomorrow and will decide if you  need BP med dose change/increase. ? ?If with recent head trauma and high blood pressure you have any motor or sensory deficits then recommend ED evaluation. ? ?Follow-up date to be determined after CT imaging review and review of your blood pressure readings that you send me.  ?Mackie Pai, PA-C  ?

## 2021-12-27 NOTE — Patient Instructions (Addendum)
Recent fall Sunday morning with mild trauma to left temporal area.  No loss of consciousness during fall and no syncope described before fall.  However you do report mild headache and intermittently off balance since event. For caution sake I decided to get CT of head without contrast.  Sent message to our referral staff to get prior authorization. ? ?Hypertension-blood pressure very high presently but you have normal neurologic exam.  On review history of whitecoat hypertension and I think your anxiety over the fall and head trauma causing BP increase as well.  Blood pressure earlier today was 160/70 and often times at home you have readings of 130/70 range.  Presently continue current BP medications.  Check blood pressure later today and tomorrow morning.  Please please update me please update me/send me a message via MyChart message via MyChart tomorrow and will decide if you need BP med dose change/increase. ? ?If with recent head trauma and high blood pressure you have any motor or sensory deficits then recommend ED evaluation. ? ?Follow-up date to be determined after CT imaging review and review of your blood pressure readings that you send me. ?

## 2022-01-04 ENCOUNTER — Ambulatory Visit: Payer: 59 | Admitting: Family Medicine

## 2022-01-09 ENCOUNTER — Other Ambulatory Visit (HOSPITAL_COMMUNITY): Payer: Self-pay

## 2022-03-27 ENCOUNTER — Encounter: Payer: Self-pay | Admitting: Family Medicine

## 2022-04-04 ENCOUNTER — Other Ambulatory Visit (HOSPITAL_COMMUNITY): Payer: Self-pay

## 2022-05-17 ENCOUNTER — Encounter: Payer: 59 | Admitting: Family Medicine

## 2022-05-21 NOTE — Patient Instructions (Incomplete)
Good to see you again today- I will be in touch with your labs asap  I would suggest getting the shingrix series at your convenience, and the flu shot and covid booster this fall  Consider getting a coronary calcium CT- I am glad to order this for you if you like anytime

## 2022-05-21 NOTE — Progress Notes (Addendum)
[facility] [address], [place], [place] [address] [phone] [id number]  Date:  05/24/2022   Name:  Sarah Rangel   DOB:  1956-05-06   MRN:  [id number]  PCP:  Darreld Mclean, MD    Chief Complaint: Annual Exam (Foot exam due/Eye exam: )   History of Present Illness:  Sarah Rangel is a 66 y.o. very pleasant female patient who presents with the following:  Pt seen today for a CPE Last seen by myself about one year ago History of hypertension and mild diabetes.  2nd A1c over 6.5 seen in 2022   Status post complete hysterectomy, she is no longer following up with GYN. 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  Recommended shingrix but she will think about this Labs: update today  Flu shot - will do later this year  Colon 2015- normal   Her BP was 134/64 at home this am, she does check on a regular basis and her BP has been 130s/65-70 Patient arrived a bit late today, admits she was feeling very anxious about this which is likely why her blood pressure is high.  She denies any chest pain or shortness of breath She is taking vit D 1000 iu daily   Lab Results  Component Value Date   HGBA1C 6.9 (H) 05/11/2021      Patient Active Problem List   Diagnosis Date Noted   Diabetes mellitus without complication ([place]) 99/83/3825   White coat hypertension 12/29/2015   Status post total abdominal hysterectomy and bilateral salpingo-oophorectomy 11/02/2015   Worsening headaches 12/05/2013   Unspecified essential hypertension 11/06/2013   Stress 11/06/2013    Past  Medical History:  Diagnosis Date   Anxiety    History of cystocele    History of shingles 2015   two occurances   Hypertension    Osteopenia 2020    Past Surgical History:  Procedure Laterality Date   ABDOMINAL HYSTERECTOMY N/A 11/02/2015   Procedure: HYSTERECTOMY ABDOMINAL;  Surgeon: Nunzio Cobbs, MD;  Location: [facility];  Service: Gynecology;  Laterality: N/A;  4 hours   ABDOMINAL SACROCOLPOPEXY N/A 11/02/2015   Procedure: ABDOMINO SACROCOLPOPEXY with Alyte mesh and Halban's Culdoplasty;  Surgeon: Nunzio Cobbs, MD;  Location: [facility];  Service: Gynecology;  Laterality: N/A;   ANTERIOR AND POSTERIOR REPAIR N/A 11/02/2015   Procedure: POSTERIOR REPAIR (RECTOCELE) ;  Surgeon: Nunzio Cobbs, MD;  Location: [facility];  Service: Gynecology;  Laterality: N/A;   BLADDER SUSPENSION N/A 11/02/2015   Procedure: TRANSVAGINAL TAPE (TVT) PROCEDURE exact mid-urethral sling;  Surgeon: Nunzio Cobbs, MD;  Location: [facility];  Service: Gynecology;  Laterality: N/A;   CYSTOSCOPY N/A 11/02/2015   Procedure: CYSTOSCOPY;  Surgeon: Nunzio Cobbs, MD;  Location: [facility];  Service: Gynecology;  Laterality: N/A;   SALPINGOOPHORECTOMY Bilateral 11/02/2015   Procedure: SALPINGO OOPHORECTOMY;  Surgeon: Nunzio Cobbs, MD;  Location: [facility];  Service: Gynecology;  Laterality: Bilateral;   WISDOM TOOTH EXTRACTION  age 66    Social History   Tobacco Use   Smoking status: Former    Types: Cigarettes   Smokeless tobacco: Never  Vaping Use   Vaping Use: Never used  Substance Use Topics   Alcohol use: Yes    Alcohol/week: 2.0 standard drinks of alcohol    Types: 2 Standard drinks or equivalent per week    Comment: occasionally 1 time monthly   Drug use: No    Family History  Problem Relation Age of Onset   Hypertension Father    Brain cancer Mother    Lymphoma Mother    Colon cancer Neg Hx    Pancreatic cancer Neg Hx    Stomach cancer Neg Hx      Not on File  Medication list has been reviewed and updated.  Current Outpatient Medications on File Prior to Visit  Medication Sig Dispense Refill   amLODipine (NORVASC) 5 MG tablet Take 1 tablet (5 mg total) by mouth daily. 90 tablet 3   olmesartan (BENICAR) 40 MG tablet TAKE 1 TABLET BY MOUTH ONCE DAILY 90 tablet 3   No current facility-administered medications on file prior to visit.    Review of Systems:  As per HPI- otherwise negative.   Physical Examination: Vitals:   05/24/22 1331  BP: (!) 172/90  Pulse: 72  Resp: 18  Temp: 98.2 F (36.8 C)  SpO2: 99%   Vitals:   05/24/22 1331  Weight: 191 lb (86.6 kg)  Height: '5\' 6"'$  (1.676 m)   Body mass index is 30.83 kg/m. Ideal Body Weight: Weight in (lb) to have BMI = 25: 154.6  GEN: no acute distress.  Overweight, looks well HEENT: Atraumatic, Normocephalic.  Ears and Nose: No external deformity. CV: RRR, No M/G/R. No JVD. No thrill. No extra heart sounds. PULM: CTA B, no wheezes, crackles, rhonchi. No retractions. No resp. distress. No accessory muscle use. ABD: S, NT, ND, +BS. No rebound. No HSM. EXTR: No c/c/e PSYCH: Normally interactive. Conversant.    Assessment and Plan: Physical exam  Essential hypertension - Plan: CBC, Comprehensive metabolic panel, amLODipine (NORVASC) 5 MG tablet, olmesartan (BENICAR) 40 MG tablet  Diabetes mellitus without complication (HCC) - Plan: Hemoglobin A1c  Screening for hyperlipidemia - Plan: Lipid panel  Screening for thyroid disorder - Plan: TSH  Vitamin D deficiency - Plan: VITAMIN D 25 Hydroxy (Vit-D Deficiency, Fractures)  Pt seen today for a CPE Will plan further follow- up pending labs. Encouraged healthy diet and exercise routine  Blood pressure elevated in clinic, patient thinks due to anxiety.  She is checking her blood pressure regularly at home and has been well controlled.  Continue current medications, refilled  We also discussed doing a coronary  calcium score today.  She would like to think about this Signed Lamar Blinks, MD  Addendum 9/7, received her labs as below.  Message to patient  Results for orders placed or performed in visit on 05/24/22  CBC  Result Value Ref Range   WBC 4.6 4.0 - 10.5 K/uL   RBC 3.95 3.87 - 5.11 Mil/uL   Platelets 180.0 150.0 - 400.0 K/uL   Hemoglobin 12.9 12.0 - 15.0 g/dL   HCT 37.3 36.0 - 46.0 %   MCV 94.3 78.0 - 100.0 fl   MCHC 34.5 30.0 - 36.0 g/dL   RDW 12.8 11.5 - 15.5 %  Comprehensive metabolic panel  Result Value Ref Range   Sodium 134 (L) 135 - 145 mEq/L   Potassium 4.4 3.5 -  5.1 mEq/L   Chloride 98 96 - 112 mEq/L   CO2 25 19 - 32 mEq/L   Glucose, Bld 113 (H) 70 - 99 mg/dL   BUN 20 6 - 23 mg/dL   Creatinine, Ser 1.13 0.40 - 1.20 mg/dL   Total Bilirubin 0.7 0.2 - 1.2 mg/dL   Alkaline Phosphatase 58 39 - 117 U/L   AST 14 0 - 37 U/L   ALT 14 0 - 35 U/L   Total Protein 6.8 6.0 - 8.3 g/dL   Albumin 4.5 3.5 - 5.2 g/dL   GFR 50.09 (L) >60.00 mL/min   Calcium 9.5 8.4 - 10.5 mg/dL  Hemoglobin A1c  Result Value Ref Range   Hgb A1c MFr Bld 6.9 (H) 4.6 - 6.5 %  Lipid panel  Result Value Ref Range   Cholesterol 179 0 - 200 mg/dL   Triglycerides 105.0 0.0 - 149.0 mg/dL   HDL 53.20 >39.00 mg/dL   VLDL 21.0 0.0 - 40.0 mg/dL   LDL Cholesterol 105 (H) 0 - 99 mg/dL   Total CHOL/HDL Ratio 3    NonHDL 125.56   TSH  Result Value Ref Range   TSH 1.72 0.35 - 5.50 uIU/mL  VITAMIN D 25 Hydroxy (Vit-D Deficiency, Fractures)  Result Value Ref Range   VITD 34.54 30.00 - 100.00 ng/mL

## 2022-05-24 ENCOUNTER — Other Ambulatory Visit (HOSPITAL_COMMUNITY): Payer: Self-pay

## 2022-05-24 ENCOUNTER — Encounter: Payer: 59 | Admitting: Family Medicine

## 2022-05-24 ENCOUNTER — Ambulatory Visit (INDEPENDENT_AMBULATORY_CARE_PROVIDER_SITE_OTHER): Payer: 59 | Admitting: Family Medicine

## 2022-05-24 VITALS — BP 160/100 | HR 72 | Temp 98.2°F | Resp 18 | Ht 66.0 in | Wt 191.0 lb

## 2022-05-24 DIAGNOSIS — I10 Essential (primary) hypertension: Secondary | ICD-10-CM | POA: Diagnosis not present

## 2022-05-24 DIAGNOSIS — Z0000 Encounter for general adult medical examination without abnormal findings: Secondary | ICD-10-CM

## 2022-05-24 DIAGNOSIS — Z13220 Encounter for screening for lipoid disorders: Secondary | ICD-10-CM | POA: Diagnosis not present

## 2022-05-24 DIAGNOSIS — E119 Type 2 diabetes mellitus without complications: Secondary | ICD-10-CM

## 2022-05-24 DIAGNOSIS — E559 Vitamin D deficiency, unspecified: Secondary | ICD-10-CM | POA: Diagnosis not present

## 2022-05-24 DIAGNOSIS — Z1329 Encounter for screening for other suspected endocrine disorder: Secondary | ICD-10-CM | POA: Diagnosis not present

## 2022-05-24 MED ORDER — OLMESARTAN MEDOXOMIL 40 MG PO TABS
ORAL_TABLET | Freq: Every day | ORAL | 3 refills | Status: DC
  Filled 2022-05-24: qty 90, fill #0
  Filled 2022-07-01: qty 90, 90d supply, fill #0
  Filled 2022-10-03: qty 90, 90d supply, fill #1
  Filled 2023-01-08 – 2023-01-11 (×2): qty 90, 90d supply, fill #2
  Filled 2023-04-29: qty 90, 90d supply, fill #3

## 2022-05-24 MED ORDER — AMLODIPINE BESYLATE 5 MG PO TABS
5.0000 mg | ORAL_TABLET | Freq: Every day | ORAL | 3 refills | Status: DC
  Filled 2022-05-24 – 2022-07-01 (×2): qty 90, 90d supply, fill #0

## 2022-05-25 ENCOUNTER — Encounter: Payer: Self-pay | Admitting: Family Medicine

## 2022-05-25 LAB — CBC
HCT: 37.3 % (ref 36.0–46.0)
Hemoglobin: 12.9 g/dL (ref 12.0–15.0)
MCHC: 34.5 g/dL (ref 30.0–36.0)
MCV: 94.3 fl (ref 78.0–100.0)
Platelets: 180 10*3/uL (ref 150.0–400.0)
RBC: 3.95 Mil/uL (ref 3.87–5.11)
RDW: 12.8 % (ref 11.5–15.5)
WBC: 4.6 10*3/uL (ref 4.0–10.5)

## 2022-05-25 LAB — LIPID PANEL
Cholesterol: 179 mg/dL (ref 0–200)
HDL: 53.2 mg/dL (ref >39.00)
LDL Cholesterol: 105 mg/dL — ABNORMAL HIGH (ref 0–99)
NonHDL: 125.56
Total CHOL/HDL Ratio: 3
Triglycerides: 105 mg/dL (ref 0.0–149.0)
VLDL: 21 mg/dL (ref 0.0–40.0)

## 2022-05-25 LAB — COMPREHENSIVE METABOLIC PANEL
ALT: 14 U/L (ref 0–35)
AST: 14 U/L (ref 0–37)
Albumin: 4.5 g/dL (ref 3.5–5.2)
Alkaline Phosphatase: 58 U/L (ref 39–117)
BUN: 20 mg/dL (ref 6–23)
CO2: 25 mEq/L (ref 19–32)
Calcium: 9.5 mg/dL (ref 8.4–10.5)
Chloride: 98 mEq/L (ref 96–112)
Creatinine, Ser: 1.13 mg/dL (ref 0.40–1.20)
GFR: 50.09 mL/min — ABNORMAL LOW (ref >60.00)
Glucose, Bld: 113 mg/dL — ABNORMAL HIGH (ref 70–99)
Potassium: 4.4 mEq/L (ref 3.5–5.1)
Sodium: 134 mEq/L — ABNORMAL LOW (ref 135–145)
Total Bilirubin: 0.7 mg/dL (ref 0.2–1.2)
Total Protein: 6.8 g/dL (ref 6.0–8.3)

## 2022-05-25 LAB — TSH: TSH: 1.72 u[IU]/mL (ref 0.35–5.50)

## 2022-05-25 LAB — VITAMIN D 25 HYDROXY (VIT D DEFICIENCY, FRACTURES): VITD: 34.54 ng/mL (ref 30.00–100.00)

## 2022-05-25 LAB — HEMOGLOBIN A1C: Hgb A1c MFr Bld: 6.9 % — ABNORMAL HIGH (ref 4.6–6.5)

## 2022-06-07 ENCOUNTER — Other Ambulatory Visit: Payer: Self-pay | Admitting: Family Medicine

## 2022-06-07 DIAGNOSIS — Z1231 Encounter for screening mammogram for malignant neoplasm of breast: Secondary | ICD-10-CM

## 2022-06-16 DIAGNOSIS — H52222 Regular astigmatism, left eye: Secondary | ICD-10-CM | POA: Diagnosis not present

## 2022-06-16 DIAGNOSIS — H524 Presbyopia: Secondary | ICD-10-CM | POA: Diagnosis not present

## 2022-06-16 DIAGNOSIS — H25813 Combined forms of age-related cataract, bilateral: Secondary | ICD-10-CM | POA: Diagnosis not present

## 2022-06-16 DIAGNOSIS — H5203 Hypermetropia, bilateral: Secondary | ICD-10-CM | POA: Diagnosis not present

## 2022-06-20 ENCOUNTER — Other Ambulatory Visit (HOSPITAL_COMMUNITY): Payer: Self-pay

## 2022-07-01 ENCOUNTER — Other Ambulatory Visit (HOSPITAL_COMMUNITY): Payer: Self-pay

## 2022-07-03 ENCOUNTER — Other Ambulatory Visit (HOSPITAL_COMMUNITY): Payer: Self-pay

## 2022-07-11 ENCOUNTER — Ambulatory Visit: Payer: 59

## 2022-08-15 ENCOUNTER — Ambulatory Visit
Admission: RE | Admit: 2022-08-15 | Discharge: 2022-08-15 | Disposition: A | Discharge status: Home / Self Care | Payer: 59 | Source: Ambulatory Visit | Attending: Family Medicine | Admitting: Family Medicine

## 2022-08-15 DIAGNOSIS — Z1231 Encounter for screening mammogram for malignant neoplasm of breast: Secondary | ICD-10-CM

## 2022-08-18 ENCOUNTER — Other Ambulatory Visit (HOSPITAL_BASED_OUTPATIENT_CLINIC_OR_DEPARTMENT_OTHER): Payer: Self-pay

## 2022-08-18 ENCOUNTER — Ambulatory Visit: Payer: 59 | Admitting: Physician Assistant

## 2022-08-18 ENCOUNTER — Encounter: Payer: Self-pay | Admitting: Physician Assistant

## 2022-08-18 ENCOUNTER — Ambulatory Visit: Payer: 59 | Admitting: Family Medicine

## 2022-08-18 VITALS — BP 170/70 | HR 85 | Temp 97.5°F | Ht 66.0 in | Wt 188.5 lb

## 2022-08-18 DIAGNOSIS — H6502 Acute serous otitis media, left ear: Secondary | ICD-10-CM | POA: Diagnosis not present

## 2022-08-18 DIAGNOSIS — J020 Streptococcal pharyngitis: Secondary | ICD-10-CM | POA: Diagnosis not present

## 2022-08-18 DIAGNOSIS — I10 Essential (primary) hypertension: Secondary | ICD-10-CM | POA: Diagnosis not present

## 2022-08-18 LAB — POCT RAPID STREP A (OFFICE): Rapid Strep A Screen: POSITIVE — AB

## 2022-08-18 MED ORDER — AMOXICILLIN-POT CLAVULANATE 875-125 MG PO TABS
1.0000 | ORAL_TABLET | Freq: Two times a day (BID) | ORAL | 0 refills | Status: DC
  Filled 2022-08-18: qty 20, 10d supply, fill #0

## 2022-08-18 NOTE — Patient Instructions (Signed)
It was great to see you!  Start augmentin for your strep and ear infection. Call if any concerns.  Take care,  Inda Coke PA-C

## 2022-08-18 NOTE — Progress Notes (Addendum)
Sarah Rangel is a 66 y.o. female here for a new problem.  History of Present Illness:   Chief Complaint  Patient presents with   Sore Throat    Pt c/o sore throat started on Monday with a deep rattling cough. Low fever 99.2 on Tuesday. Also having left ear pain.    HPI  Sore throat Patient is complaining of a sore throat and dry cough starting 4 days ago. She reports sore spots on the lining of her cheeks. She confirms a low grade fever of 99.2 degrees the following day. Patient explains that she began having left ear pain this morning. She manages sore throat symptoms with a salt water gargle.  Denies: Nausea, vomiting, chest pain, shortness of breath  HTN Currently taking amlodipine 5 mg and olmesartan 40 mg. At home blood pressure readings are: Normal per patient. Patient denies chest pain, SOB, blurred vision, dizziness, unusual headaches, lower leg swelling. Patient is  compliant with medication. Denies excessive caffeine intake, stimulant usage, excessive alcohol intake, or increase in salt consumption.  BP Readings from Last 3 Encounters:  08/18/22 (!) 170/70  05/24/22 (!) 160/100  12/28/21 140/62     Past Medical History:  Diagnosis Date   Anxiety    History of cystocele    History of shingles 2015   two occurances   Hypertension    Osteopenia 2020     Social History   Tobacco Use   Smoking status: Former    Types: Cigarettes   Smokeless tobacco: Never  Vaping Use   Vaping Use: Never used  Substance Use Topics   Alcohol use: Yes    Alcohol/week: 2.0 standard drinks of alcohol    Types: 2 Standard drinks or equivalent per week    Comment: occasionally 1 time monthly   Drug use: No    Past Surgical History:  Procedure Laterality Date   ABDOMINAL HYSTERECTOMY N/A 11/02/2015   Procedure: HYSTERECTOMY ABDOMINAL;  Surgeon: Nunzio Cobbs, MD;  Location: [facility];  Service: Gynecology;  Laterality: N/A;  4 hours   ABDOMINAL SACROCOLPOPEXY N/A  11/02/2015   Procedure: ABDOMINO SACROCOLPOPEXY with Alyte mesh and Halban's Culdoplasty;  Surgeon: Nunzio Cobbs, MD;  Location: [facility];  Service: Gynecology;  Laterality: N/A;   ANTERIOR AND POSTERIOR REPAIR N/A 11/02/2015   Procedure: POSTERIOR REPAIR (RECTOCELE) ;  Surgeon: Nunzio Cobbs, MD;  Location: [facility];  Service: Gynecology;  Laterality: N/A;   BLADDER SUSPENSION N/A 11/02/2015   Procedure: TRANSVAGINAL TAPE (TVT) PROCEDURE exact mid-urethral sling;  Surgeon: Nunzio Cobbs, MD;  Location: [facility];  Service: Gynecology;  Laterality: N/A;   CYSTOSCOPY N/A 11/02/2015   Procedure: CYSTOSCOPY;  Surgeon: Nunzio Cobbs, MD;  Location: [facility];  Service: Gynecology;  Laterality: N/A;   SALPINGOOPHORECTOMY Bilateral 11/02/2015   Procedure: SALPINGO OOPHORECTOMY;  Surgeon: Nunzio Cobbs, MD;  Location: [facility];  Service: Gynecology;  Laterality: Bilateral;   WISDOM TOOTH EXTRACTION     age 66    Family History  Problem Relation Age of Onset   Hypertension Father    Brain cancer Mother    Lymphoma Mother    Colon cancer Neg Hx    Pancreatic cancer Neg Hx    Stomach cancer Neg Hx     No Known Allergies  Current Medications:   Current Outpatient Medications:    amLODipine (NORVASC) 5 MG tablet, Take 1 tablet (5 mg total)  by mouth daily., Disp: 90 tablet, Rfl: 3   amoxicillin-clavulanate (AUGMENTIN) 875-125 MG tablet, Take 1 tablet by mouth 2 (two) times daily., Disp: 20 tablet, Rfl: 0   docusate sodium (COLACE) 100 MG capsule, Take 100 mg by mouth daily., Disp: , Rfl:    olmesartan (BENICAR) 40 MG tablet, TAKE 1 TABLET BY MOUTH ONCE DAILY, Disp: 90 tablet, Rfl: 3   Vitamin D, Cholecalciferol, 25 MCG (1000 UT) TABS, Take 1 tablet by mouth daily in the afternoon., Disp: , Rfl:    Review of Systems:   Review of Systems  HENT:  Positive for ear pain (left) and sore throat.   Respiratory:  Positive for cough.     Vitals:   Vitals:    08/18/22 1119 08/18/22 1136  BP: (!) 150/86 (!) 170/70  Pulse: 85   Temp: (!) 97.5 F (36.4 C)   TempSrc: Temporal   SpO2: 98%   Weight: 188 lb 8 oz (85.5 kg)   Height: '5\' 6"'$  (1.676 m)      Body mass index is 30.42 kg/m.  Physical Exam:   Physical Exam Vitals and nursing note reviewed.  Constitutional:      General: She is not in acute distress.    Appearance: Normal appearance. She is well-developed. She is not ill-appearing or toxic-appearing.  HENT:     Head: Normocephalic and atraumatic.     Right Ear: Tympanic membrane, ear canal and external ear normal. Tympanic membrane is not erythematous, retracted or bulging.     Left Ear: Ear canal and external ear normal. A middle ear effusion is present. Tympanic membrane is erythematous. Tympanic membrane is not retracted or bulging.     Nose: Nose normal.     Right Sinus: No maxillary sinus tenderness or frontal sinus tenderness.     Left Sinus: No maxillary sinus tenderness or frontal sinus tenderness.     Mouth/Throat:     Pharynx: Uvula midline. Posterior oropharyngeal erythema present.     Tonsils: No tonsillar abscesses. 1+ on the right. 1+ on the left.  Eyes:     General: Lids are normal.     Extraocular Movements: Extraocular movements intact.     Conjunctiva/sclera: Conjunctivae normal.     Pupils: Pupils are equal, round, and reactive to light.  Neck:     Trachea: Trachea normal.  Cardiovascular:     Rate and Rhythm: Normal rate and regular rhythm.     Heart sounds: Normal heart sounds, S1 normal and S2 normal. No murmur heard.    No gallop.  Pulmonary:     Effort: Pulmonary effort is normal. No respiratory distress.     Breath sounds: Normal breath sounds. No decreased breath sounds, wheezing, rhonchi or rales.  Lymphadenopathy:     Cervical: No cervical adenopathy.  Skin:    General: Skin is warm and dry.  Neurological:     Mental Status: She is alert and oriented to person, place, and time.  Psychiatric:         Speech: Speech normal.        Behavior: Behavior normal. Behavior is cooperative.        Judgment: Judgment normal.    Results for orders placed or performed in visit on 08/18/22  POCT rapid strep A  Result Value Ref Range   Rapid Strep A Screen Positive (A) Negative    Assessment and Plan:   Strep pharyngitis; Non-recurrent acute serous otitis media of left ear Strep test is positive She does have  evidence of ear infection in right ear and concerns for early sinusitis We will start oral Augmentin x10 days Push fluids and rest Recommend close follow-up if any ongoing symptoms or worsening, or if lack of improvement  White coat syndrome with diagnosis of hypertension Above goal No evidence of end-organ damage Home readings are within normal limits per patient report Continue management under PCP If chest pain, headache, shortness of breath, patient was advised to go to the ER  I,Verona Buck,acting as a scribe for [employer], PA.,have documented all relevant documentation on the behalf of Inda Coke, PA,as directed by  Inda Coke, PA while in the presence of Inda Coke, [place].  I, Inda Coke, [place], have reviewed all documentation for this visit. The documentation on 08/18/22 for the exam, diagnosis, procedures, and orders are all accurate and complete.  Inda Coke, PA-C

## 2022-08-21 ENCOUNTER — Encounter: Payer: Self-pay | Admitting: Physician Assistant

## 2022-08-31 ENCOUNTER — Encounter: Payer: Self-pay | Admitting: Medical

## 2022-08-31 ENCOUNTER — Other Ambulatory Visit (HOSPITAL_COMMUNITY): Payer: Self-pay

## 2022-08-31 ENCOUNTER — Ambulatory Visit: Payer: 59 | Admitting: Medical

## 2022-08-31 ENCOUNTER — Ambulatory Visit: Payer: 59 | Admitting: Family Medicine

## 2022-08-31 VITALS — BP 170/75 | HR 85 | Temp 97.5°F | Resp 18 | Ht 66.0 in | Wt 185.6 lb

## 2022-08-31 DIAGNOSIS — H938X2 Other specified disorders of left ear: Secondary | ICD-10-CM | POA: Diagnosis not present

## 2022-08-31 DIAGNOSIS — I10 Essential (primary) hypertension: Secondary | ICD-10-CM | POA: Diagnosis not present

## 2022-08-31 MED ORDER — AZITHROMYCIN 250 MG PO TABS
ORAL_TABLET | ORAL | 0 refills | Status: AC
  Filled 2022-08-31: qty 6, 5d supply, fill #0

## 2022-08-31 MED ORDER — METHYLPREDNISOLONE 4 MG PO TABS
ORAL_TABLET | ORAL | 0 refills | Status: DC
  Filled 2022-08-31: qty 10, 4d supply, fill #0

## 2022-08-31 MED ORDER — AMLODIPINE BESYLATE 5 MG PO TABS
ORAL_TABLET | ORAL | 0 refills | Status: DC
  Filled 2022-08-31: qty 180, 90d supply, fill #0

## 2022-08-31 MED ORDER — AMLODIPINE BESYLATE 10 MG PO TABS
20.0000 mg | ORAL_TABLET | Freq: Every day | ORAL | 0 refills | Status: DC
  Filled 2022-08-31: qty 180, 90d supply, fill #0

## 2022-08-31 MED ORDER — FLUTICASONE PROPIONATE 50 MCG/ACT NA SUSP
2.0000 | Freq: Every day | NASAL | 1 refills | Status: AC
  Filled 2022-08-31: qty 16, 30d supply, fill #0
  Filled 2022-11-30: qty 16, 30d supply, fill #1

## 2022-08-31 NOTE — Addendum Note (Signed)
Addended by: Anabel Halon on: 08/31/2022 04:35 PM   Modules accepted: Orders

## 2022-08-31 NOTE — Progress Notes (Signed)
Subjective:    Patient ID: Sarah Rangel, female    DOB: 06/24/1956, 66 y.o.   MRN: [id number]  HPI  Pt in for recent ear pain.  Pt had strep throat early in month. Pt also told had ear infection and also had some sinus pressure. Pt states 4 days ago she was feeling normal but gradually has pressure sensation/ear discomfort.   Pt has been using flonase the entire. She is concerned for ear infection. Has been using flonase already and using daily.  Htn- bp high. She attributes to white coat. No motor or neuro deficits. Pt states over [id number] systolic at times. On Tuesday 144/80.    Review of Systems  Constitutional:  Negative for fatigue and fever.  HENT:  Negative for congestion, drooling, ear pain and hearing loss.        Ear pressure.  Respiratory:  Negative for cough, chest tightness, shortness of breath and wheezing.   Cardiovascular:  Negative for chest pain and palpitations.  Gastrointestinal:  Negative for abdominal pain.  Musculoskeletal:  Negative for arthralgias and back pain.  Skin:  Negative for rash.  Neurological:  Negative for numbness.  Psychiatric/Behavioral:  Negative for behavioral problems and decreased concentration.     Past Medical History:  Diagnosis Date   Anxiety    History of cystocele    History of shingles 2015   two occurances   Hypertension    Osteopenia 2020     Social History   Socioeconomic History   Marital status: Married    Spouse name: Not on file   Number of children: Not on file   Years of education: Not on file   Highest education level: Not on file  Occupational History   Not on file  Tobacco Use   Smoking status: Former    Types: Cigarettes   Smokeless tobacco: Never  Vaping Use   Vaping Use: Never used  Substance and Sexual Activity   Alcohol use: Yes    Alcohol/week: 2.0 standard drinks of alcohol    Types: 2 Standard drinks or equivalent per week    Comment: occasionally 1 time monthly   Drug use:  No   Sexual activity: Yes    Partners: Male    Birth control/protection: Other-see comments, Post-menopausal, Surgical    Comment: vasectomy/TAH/BSO  Other Topics Concern   Not on file  Social History Narrative   Not on file   Social Determinants of Health   Financial Resource Strain: Not on file  Food Insecurity: Not on file  Transportation Needs: Not on file  Physical Activity: Not on file  Stress: Not on file  Social Connections: Not on file  Intimate Partner Violence: Not on file    Past Surgical History:  Procedure Laterality Date   ABDOMINAL HYSTERECTOMY N/A 11/02/2015   Procedure: HYSTERECTOMY ABDOMINAL;  Surgeon: Nunzio Cobbs, MD;  Location: [facility];  Service: Gynecology;  Laterality: N/A;  4 hours   ABDOMINAL SACROCOLPOPEXY N/A 11/02/2015   Procedure: ABDOMINO SACROCOLPOPEXY with Alyte mesh and Halban's Culdoplasty;  Surgeon: Nunzio Cobbs, MD;  Location: [facility];  Service: Gynecology;  Laterality: N/A;   ANTERIOR AND POSTERIOR REPAIR N/A 11/02/2015   Procedure: POSTERIOR REPAIR (RECTOCELE) ;  Surgeon: Nunzio Cobbs, MD;  Location: [facility];  Service: Gynecology;  Laterality: N/A;   BLADDER SUSPENSION N/A 11/02/2015   Procedure: TRANSVAGINAL TAPE (TVT) PROCEDURE exact mid-urethral sling;  Surgeon: Nunzio Cobbs, MD;  Location: [facility];  Service: Gynecology;  Laterality: N/A;   CYSTOSCOPY N/A 11/02/2015   Procedure: CYSTOSCOPY;  Surgeon: Nunzio Cobbs, MD;  Location: [facility];  Service: Gynecology;  Laterality: N/A;   SALPINGOOPHORECTOMY Bilateral 11/02/2015   Procedure: SALPINGO OOPHORECTOMY;  Surgeon: Nunzio Cobbs, MD;  Location: [facility];  Service: Gynecology;  Laterality: Bilateral;   WISDOM TOOTH EXTRACTION     age 24    Family History  Problem Relation Age of Onset   Hypertension Father    Brain cancer Mother    Lymphoma Mother    Colon cancer Neg Hx    Pancreatic cancer Neg Hx    Stomach cancer Neg Hx      No Known Allergies  Current Outpatient Medications on File Prior to Visit  Medication Sig Dispense Refill   amLODipine (NORVASC) 5 MG tablet Take 1 tablet (5 mg total) by mouth daily. 90 tablet 3   amoxicillin-clavulanate (AUGMENTIN) 875-125 MG tablet Take 1 tablet by mouth 2 (two) times daily. 20 tablet 0   docusate sodium (COLACE) 100 MG capsule Take 100 mg by mouth daily.     olmesartan (BENICAR) 40 MG tablet TAKE 1 TABLET BY MOUTH ONCE DAILY 90 tablet 3   Vitamin D, Cholecalciferol, 25 MCG (1000 UT) TABS Take 1 tablet by mouth daily in the afternoon.     No current facility-administered medications on file prior to visit.    BP (!) 170/75   Pulse 85   Temp (!) 97.5 F (36.4 C)   Resp 18   Ht '5\' 6"'$  (1.676 m)   Wt 185 lb 9.6 oz (84.2 kg)   LMP 09/18/2008 (Approximate)   SpO2 98%   BMI 29.96 kg/m        Objective:   Physical Exam  General- No acute distress. Pleasant patient. Neck- Full range of motion, no jvd Lungs- Clear, even and unlabored. Heart- regular rate and rhythm. Neurologic- CNII- XII grossly intact.   Heent- no frontal or maxillary sinus pressure to palpation.       Assessment & Plan:   Patient Instructions  Eustachian tube dysfunction post ear infection. Ear does not look infected presently. You have been using flonase daily and have pressure despite this. So will add low dose 4 day taper medrol. Rx advisement given.  You expressed concern for ear infection and want med available to use if needed. If you have significant worse pain despite the above then can start azithromycin.  For htn- do recommend you increase amlodipine to 10 mg daily and continue benicar. Check bp daily.   Follow up in 7-10 days or sooner if needed.   Mackie Pai, PA-C

## 2022-08-31 NOTE — Patient Instructions (Addendum)
Eustachian tube dysfunction post ear infection. Ear does not look infected presently. You have been using flonase daily and have pressure despite this. So will add low dose 4 day taper medrol. Rx advisement given.  You expressed concern for ear infection and want med available to use if needed. If you have significant worse pain despite the above then can start azithromycin.  For htn- do recommend you increase amlodipine to 10 mg daily and continue benicar. Check bp daily.   Rx advisement on steroid use. Eat low sugar diet.  Follow up in 7-10 days or sooner if needed.

## 2022-10-24 ENCOUNTER — Telehealth: Payer: Self-pay | Admitting: Internal Medicine

## 2022-10-24 NOTE — Telephone Encounter (Signed)
LVM for patient to call back to schedule procedure with Dr. Henrene Pastor.

## 2022-10-24 NOTE — Telephone Encounter (Signed)
Good morning Dr. Henrene Pastor,   Patient called stating she wanted to schedule her colonoscopy. I advised patient that she had been placed for a 10 year recall and would not be due until February of 2025. Patient stated she wanted to see if she could go ahead and have procedure, even though she is not due and she's not having issues.    Please advise.

## 2022-10-24 NOTE — Telephone Encounter (Signed)
Okay to schedule for history of adenomatous colon polyps

## 2022-11-08 ENCOUNTER — Other Ambulatory Visit (HOSPITAL_COMMUNITY): Payer: Self-pay

## 2022-11-08 ENCOUNTER — Ambulatory Visit (AMBULATORY_SURGERY_CENTER): Payer: 59 | Admitting: *Deleted

## 2022-11-08 ENCOUNTER — Encounter: Payer: Self-pay | Admitting: Internal Medicine

## 2022-11-08 VITALS — Ht 66.0 in | Wt 188.0 lb

## 2022-11-08 DIAGNOSIS — Z86010 Personal history of colonic polyps: Secondary | ICD-10-CM

## 2022-11-08 MED ORDER — NA SULFATE-K SULFATE-MG SULF 17.5-3.13-1.6 GM/177ML PO SOLN
1.0000 | Freq: Once | ORAL | 0 refills | Status: AC
  Filled 2022-11-08 – 2022-11-20 (×2): qty 354, 1d supply, fill #0

## 2022-11-08 NOTE — Progress Notes (Signed)
No egg or soy allergy known to patient  No issues known to pt with past sedation with any surgeries or procedures Patient denies ever being told they had issues or difficulty with intubation  No FH of Malignant Hyperthermia Pt is not on diet pills Pt is not on  home 02  Pt is not on blood thinners  Pt denies issues with constipation  No A fib or A flutter Have any cardiac testing pending--no Pt instructed to use [employer] or GoodRx for a price reduction on prep     Pt.stated "I am not having problems,just having it done because a f/u colonoscopy recommended by doctor". Patient's chart reviewed by Osvaldo Angst CNRA prior to previsit and patient appropriate for the [place].  Previsit completed and red dot placed by patient's name on their procedure day (on provider's schedule).

## 2022-11-16 ENCOUNTER — Encounter: Payer: Self-pay | Admitting: Internal Medicine

## 2022-11-16 ENCOUNTER — Encounter: Payer: Self-pay | Admitting: Gastroenterology

## 2022-11-20 ENCOUNTER — Other Ambulatory Visit (HOSPITAL_COMMUNITY): Payer: Self-pay

## 2022-11-22 ENCOUNTER — Ambulatory Visit (AMBULATORY_SURGERY_CENTER): Payer: 59 | Admitting: Internal Medicine

## 2022-11-22 ENCOUNTER — Encounter: Payer: Self-pay | Admitting: Internal Medicine

## 2022-11-22 VITALS — BP 133/48 | HR 61 | Temp 97.8°F | Resp 11 | Ht 66.0 in | Wt 188.0 lb

## 2022-11-22 DIAGNOSIS — Z860100 Personal history of colon polyps, unspecified: Secondary | ICD-10-CM

## 2022-11-22 DIAGNOSIS — Z09 Encounter for follow-up examination after completed treatment for conditions other than malignant neoplasm: Secondary | ICD-10-CM | POA: Diagnosis not present

## 2022-11-22 DIAGNOSIS — Z1211 Encounter for screening for malignant neoplasm of colon: Secondary | ICD-10-CM | POA: Diagnosis not present

## 2022-11-22 DIAGNOSIS — I10 Essential (primary) hypertension: Secondary | ICD-10-CM | POA: Diagnosis not present

## 2022-11-22 DIAGNOSIS — F419 Anxiety disorder, unspecified: Secondary | ICD-10-CM | POA: Diagnosis not present

## 2022-11-22 DIAGNOSIS — D122 Benign neoplasm of ascending colon: Secondary | ICD-10-CM

## 2022-11-22 MED ORDER — SODIUM CHLORIDE 0.9 % IV SOLN: 500.0000 mL | Freq: Once | INTRAVENOUS | Status: DC

## 2022-11-22 NOTE — Progress Notes (Signed)
Called to room to assist during endoscopic procedure.  Patient ID and intended procedure confirmed with present staff. Received instructions for my participation in the procedure from the performing physician.  

## 2022-11-22 NOTE — Progress Notes (Signed)
Pt's BP high 182/87- she states she does have a history of white coat syndrome- CRNA made aware   Pt's states no medical or surgical changes since previsit or office visit.

## 2022-11-22 NOTE — Patient Instructions (Signed)
Handouts Provided:  Polyps  Repeat colonoscopy in 7 years for surveillance.  YOU HAD AN ENDOSCOPIC PROCEDURE TODAY AT [facility]:   Refer to the procedure report that was given to you for any specific questions about what was found during the examination.  If the procedure report does not answer your questions, please call your gastroenterologist to clarify.  If you requested that your care partner not be given the details of your procedure findings, then the procedure report has been included in a sealed envelope for you to review at your convenience later.  YOU SHOULD EXPECT: Some feelings of bloating in the abdomen. Passage of more gas than usual.  Walking can help get rid of the air that was put into your GI tract during the procedure and reduce the bloating. If you had a lower endoscopy (such as a colonoscopy or flexible sigmoidoscopy) you may notice spotting of blood in your stool or on the toilet paper. If you underwent a bowel prep for your procedure, you may not have a normal bowel movement for a few days.  Please Note:  You might notice some irritation and congestion in your nose or some drainage.  This is from the oxygen used during your procedure.  There is no need for concern and it should clear up in a day or so.  SYMPTOMS TO REPORT IMMEDIATELY:  Following lower endoscopy (colonoscopy or flexible sigmoidoscopy):  Excessive amounts of blood in the stool  Significant tenderness or worsening of abdominal pains  Swelling of the abdomen that is new, acute  Fever of 100F or higher  For urgent or emergent issues, a gastroenterologist can be reached at any hour by calling [phone]. Do not use MyChart messaging for urgent concerns.    DIET:  We do recommend a small meal at first, but then you may proceed to your regular diet.  Drink plenty of fluids but you should avoid alcoholic beverages for 24 hours.  ACTIVITY:  You should plan to take it easy for the rest of  today and you should NOT DRIVE or use heavy machinery until tomorrow (because of the sedation medicines used during the test).    FOLLOW UP: Our staff will call the number listed on your records the next business day following your procedure.  We will call around 7:15- 8:00 am to check on you and address any questions or concerns that you may have regarding the information given to you following your procedure. If we do not reach you, we will leave a message.     If any biopsies were taken you will be contacted by phone or by letter within the next 1-3 weeks.  Please call us at [phone] if you have not heard about the biopsies in 3 weeks.    SIGNATURES/CONFIDENTIALITY: You and/or your care partner have signed paperwork which will be entered into your electronic medical record.  These signatures attest to the fact that that the information above on your After Visit Summary has been reviewed and is understood.  Full responsibility of the confidentiality of this discharge information lies with you and/or your care-partner.

## 2022-11-22 NOTE — Progress Notes (Signed)
Report to PACU, RN, vss, BBS= Clear.  

## 2022-11-22 NOTE — Op Note (Signed)
[facility] Patient Name: Sarah Rangel Procedure Date: 11/22/2022 11:14 AM MRN: [id number] Endoscopist: Docia Chuck. Henrene Pastor , MD, [id number] Age: 67 Referring MD:  Date of Birth: 1956-05-16 Gender: Female Account #: [id number] Procedure:                Colonoscopy with cold snare polypectomy x 1; with                            biopsy polypectomy x 1 Indications:              High risk colon cancer surveillance: Personal                            history of non-advanced adenoma Medicines:                Monitored Anesthesia Care Procedure:                Pre-Anesthesia Assessment:                           - Prior to the procedure, a History and Physical                            was performed, and patient medications and                            allergies were reviewed. The patient's tolerance of                            previous anesthesia was also reviewed. The risks                            and benefits of the procedure and the sedation                            options and risks were discussed with the patient.                            All questions were answered, and informed consent                            was obtained. Prior Anticoagulants: The patient has                            taken no anticoagulant or antiplatelet agents. ASA                            Grade Assessment: II - A patient with mild systemic                            disease. After reviewing the risks and benefits,                            the patient was deemed in satisfactory condition to  undergo the procedure.                           After obtaining informed consent, the colonoscope                            was passed under direct vision. Throughout the                            procedure, the patient's blood pressure, pulse, and                            oxygen saturations were monitored continuously. The                            CF [id number]  was introduced through the anus                            and advanced to the the cecum, identified by                            appendiceal orifice and ileocecal valve. The                            ileocecal valve, appendiceal orifice, and rectum                            were photographed. The quality of the bowel                            preparation was excellent. The colonoscopy was                            performed without difficulty. The patient tolerated                            the procedure well. The bowel preparation used was                            SUPREP via split dose instruction. Scope In: 11:26:55 AM Scope Out: 11:42:37 AM Scope Withdrawal Time: 0 hours 10 minutes 25 seconds  Total Procedure Duration: 0 hours 15 minutes 42 seconds  Findings:                 A 4 mm polyp was found in the ascending colon. The                            polyp was removed with a cold snare. Resection and                            retrieval were complete.                           A less than 1 mm polyp was found in the ascending  colon. The polyp was removed with a jumbo cold                            forceps. Resection and retrieval were complete.                           Diverticula were found in the sigmoid colon.                           The exam was otherwise without abnormality on                            direct and retroflexion views. Complications:            No immediate complications. Estimated blood loss:                            None. Estimated Blood Loss:     Estimated blood loss: none. Impression:               - One 4 mm polyp in the ascending colon, removed                            with a cold snare. Resected and retrieved.                           - One less than 1 mm polyp in the ascending colon,                            removed with a jumbo cold forceps. Resected and                            retrieved.                            - Diverticulosis in the sigmoid colon.                           - The examination was otherwise normal on direct                            and retroflexion views. Recommendation:           - Repeat colonoscopy in 7 years for surveillance.                           - Patient has a contact number available for                            emergencies. The signs and symptoms of potential                            delayed complications were discussed with the                            patient.  Return to normal activities tomorrow.                            Written discharge instructions were provided to the                            patient.                           - Resume previous diet.                           - Continue present medications.                           - Await pathology results. Docia Chuck. Henrene Pastor, MD 11/22/2022 11:51:43 AM This report has been signed electronically.

## 2022-11-22 NOTE — Progress Notes (Signed)
HISTORY OF PRESENT ILLNESS:  Sarah Rangel is a 67 y.o. female with history of nonadvanced adenoma.  Presents now for surveillance colonoscopy.  Last examination 2015  REVIEW OF SYSTEMS:  All non-GI ROS negative except for  Past Medical History:  Diagnosis Date   Anxiety    History of cystocele    History of shingles 2015   two occurances   Hypertension    Osteopenia 2020    Past Surgical History:  Procedure Laterality Date   ABDOMINAL HYSTERECTOMY N/A 11/02/2015   Procedure: HYSTERECTOMY ABDOMINAL;  Surgeon: Nunzio Cobbs, MD;  Location: [facility];  Service: Gynecology;  Laterality: N/A;  4 hours   ABDOMINAL SACROCOLPOPEXY N/A 11/02/2015   Procedure: ABDOMINO SACROCOLPOPEXY with Alyte mesh and Halban's Culdoplasty;  Surgeon: Nunzio Cobbs, MD;  Location: [facility];  Service: Gynecology;  Laterality: N/A;   ANTERIOR AND POSTERIOR REPAIR N/A 11/02/2015   Procedure: POSTERIOR REPAIR (RECTOCELE) ;  Surgeon: Nunzio Cobbs, MD;  Location: [facility];  Service: Gynecology;  Laterality: N/A;   BLADDER SUSPENSION N/A 11/02/2015   Procedure: TRANSVAGINAL TAPE (TVT) PROCEDURE exact mid-urethral sling;  Surgeon: Nunzio Cobbs, MD;  Location: [facility];  Service: Gynecology;  Laterality: N/A;   COLONOSCOPY     CYSTOSCOPY N/A 11/02/2015   Procedure: CYSTOSCOPY;  Surgeon: Nunzio Cobbs, MD;  Location: [facility];  Service: Gynecology;  Laterality: N/A;   SALPINGOOPHORECTOMY Bilateral 11/02/2015   Procedure: SALPINGO OOPHORECTOMY;  Surgeon: Nunzio Cobbs, MD;  Location: [facility];  Service: Gynecology;  Laterality: Bilateral;   WISDOM TOOTH EXTRACTION     age 75    Social History Gray  reports that she has quit smoking. Her smoking use included cigarettes. She has never used smokeless tobacco. She reports current alcohol use of about 2.0 standard drinks of alcohol per week. She reports that she does not use drugs.  family  history includes Brain cancer in her mother; Hypertension in her father; Lymphoma in her mother.  No Known Allergies     PHYSICAL EXAMINATION: Vital signs: BP (!) 173/89   Pulse 62   Temp 97.8 F (36.6 C) (Skin)   Resp 17   Ht '5\' 6"'$  (1.676 m)   Wt 188 lb (85.3 kg)   LMP 09/18/2008 (Approximate)   SpO2 98%   BMI 30.34 kg/m  General: Well-developed, well-nourished, no acute distress HEENT: Sclerae are anicteric, conjunctiva pink. Oral mucosa intact Lungs: Clear Heart: Regular Abdomen: soft, nontender, nondistended, no obvious ascites, no peritoneal signs, normal bowel sounds. No organomegaly. Extremities: No edema Psychiatric: alert and oriented x3. Cooperative     ASSESSMENT:  History of adenomatous colon polyp   PLAN:   Surveillance colonoscopy

## 2022-11-23 ENCOUNTER — Telehealth: Payer: Self-pay

## 2022-11-23 NOTE — Telephone Encounter (Signed)
  Follow up Call-     11/22/2022   10:45 AM  Call back number  Post procedure Call Back phone  # [phone]  Permission to leave phone message Yes     Left message

## 2022-11-25 ENCOUNTER — Encounter: Payer: Self-pay | Admitting: Internal Medicine

## 2022-11-27 ENCOUNTER — Other Ambulatory Visit: Payer: Self-pay | Admitting: Medical

## 2022-11-27 ENCOUNTER — Other Ambulatory Visit (HOSPITAL_COMMUNITY): Payer: Self-pay

## 2022-11-27 MED ORDER — AMLODIPINE BESYLATE 5 MG PO TABS
10.0000 mg | ORAL_TABLET | Freq: Every day | ORAL | 0 refills | Status: DC
  Filled 2022-11-27: qty 180, 90d supply, fill #0

## 2023-01-11 ENCOUNTER — Other Ambulatory Visit (HOSPITAL_COMMUNITY): Payer: Self-pay

## 2023-01-11 ENCOUNTER — Other Ambulatory Visit: Payer: Self-pay

## 2023-02-22 ENCOUNTER — Other Ambulatory Visit (HOSPITAL_COMMUNITY): Payer: Self-pay

## 2023-02-22 ENCOUNTER — Other Ambulatory Visit: Payer: Self-pay | Admitting: Family Medicine

## 2023-02-22 MED ORDER — AMLODIPINE BESYLATE 5 MG PO TABS
10.0000 mg | ORAL_TABLET | Freq: Every day | ORAL | 0 refills | Status: DC
  Filled 2023-02-22: qty 180, 90d supply, fill #0

## 2023-04-29 ENCOUNTER — Other Ambulatory Visit: Payer: Self-pay

## 2023-05-03 ENCOUNTER — Other Ambulatory Visit (HOSPITAL_COMMUNITY): Payer: Self-pay

## 2023-05-03 ENCOUNTER — Encounter (INDEPENDENT_AMBULATORY_CARE_PROVIDER_SITE_OTHER): Payer: Self-pay

## 2023-05-27 NOTE — Progress Notes (Unsigned)
[facility] [address], [address] [place], [place] [address] [phone]  Date:  05/30/2023   Name:  Sarah Rangel   DOB:  12-May-1956   MRN:  [id number]  PCP:  Pearline Cables, MD    Chief Complaint: No chief complaint on file.   History of Present Illness:  Sarah Rangel is a 67 y.o. very pleasant female patient who presents with the following:  Patient seen today for physical- History of hypertension and diabetes.   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 Former smoker, she has a 10 to 15 pack year history and as such does not qualify for lung cancer screening Occasional alcohol  Shingles vaccine Can update lab work Update foot exam Eye exam Needs urine micro COVID booster Mammogram due in November Bone density can also be updated this fall Colonoscopy completed earlier this year  Amlodipine 5 Olmesartan 40   Patient Active Problem List   Diagnosis Date Noted   Diabetes mellitus without complication (HCC) 07/04/2016   White coat syndrome with diagnosis of hypertension 12/29/2015   Status post total abdominal hysterectomy and bilateral salpingo-oophorectomy 11/02/2015   Worsening headaches 12/05/2013   Unspecified essential hypertension 11/06/2013   Stress 11/06/2013    Past Medical History:  Diagnosis Date   Anxiety    History of cystocele    History of shingles 2015   two occurances   Hypertension    Osteopenia 2020    Past Surgical History:  Procedure Laterality Date   ABDOMINAL HYSTERECTOMY N/A 11/02/2015   Procedure: HYSTERECTOMY ABDOMINAL;  Surgeon: Patton Salles, MD;  Location: [facility];  Service: Gynecology;  Laterality: N/A;  4 hours   ABDOMINAL SACROCOLPOPEXY N/A 11/02/2015   Procedure: ABDOMINO SACROCOLPOPEXY with Alyte  mesh and Halban's Culdoplasty;  Surgeon: Patton Salles, MD;  Location: [facility];  Service: Gynecology;  Laterality: N/A;   ANTERIOR AND POSTERIOR REPAIR N/A 11/02/2015   Procedure: POSTERIOR REPAIR (RECTOCELE) ;  Surgeon: Patton Salles, MD;  Location: [facility];  Service: Gynecology;  Laterality: N/A;   BLADDER SUSPENSION N/A 11/02/2015   Procedure: TRANSVAGINAL TAPE (TVT) PROCEDURE exact mid-urethral sling;  Surgeon: Patton Salles, MD;  Location: [facility];  Service: Gynecology;  Laterality: N/A;   COLONOSCOPY     CYSTOSCOPY N/A 11/02/2015   Procedure: CYSTOSCOPY;  Surgeon: Patton Salles, MD;  Location: [facility];  Service: Gynecology;  Laterality: N/A;   SALPINGOOPHORECTOMY Bilateral 11/02/2015   Procedure: SALPINGO OOPHORECTOMY;  Surgeon: Patton Salles, MD;  Location: [facility];  Service: Gynecology;  Laterality: Bilateral;   WISDOM TOOTH EXTRACTION     age 77    Social History   Tobacco Use   Smoking status: Former    Types: Cigarettes   Smokeless tobacco: Never  Vaping Use   Vaping status: Never Used  Substance Use Topics   Alcohol use: Yes    Alcohol/week: 2.0 standard drinks of alcohol    Types: 2 Standard drinks or equivalent per week    Comment: occasionally 1 time monthly   Drug use: No    Family History  Problem Relation Age of Onset   Brain cancer Mother    Lymphoma Mother    Hypertension Father    Colon cancer Neg  Hx    Pancreatic cancer Neg Hx    Stomach cancer Neg Hx    Colon polyps Neg Hx    Crohn's disease Neg Hx    Esophageal cancer Neg Hx    Rectal cancer Neg Hx    Ulcerative colitis Neg Hx     No Known Allergies  Medication list has been reviewed and updated.  Current Outpatient Medications on File Prior to Visit  Medication Sig Dispense Refill   amLODipine (NORVASC) 5 MG tablet Take 2 tablets (10 mg total) by mouth daily. 180 tablet 0   amoxicillin-clavulanate (AUGMENTIN) 875-125 MG tablet Take 1 tablet  by mouth 2 (two) times daily. 20 tablet 0   docusate sodium (COLACE) 100 MG capsule Take 100 mg by mouth daily.     fluticasone (FLONASE) 50 MCG/ACT nasal spray Place 2 sprays into both nostrils daily. (Patient taking differently: Place 2 sprays into both nostrils as needed.) 16 g 1   methylPREDNISolone (MEDROL) 4 MG tablet Take 4 tablets by mouth on day 1 then decrease by 1 tablet daily until finished (4-3-2-1) 10 tablet 0   olmesartan (BENICAR) 40 MG tablet TAKE 1 TABLET BY MOUTH ONCE DAILY 90 tablet 3   Vitamin D, Cholecalciferol, 25 MCG (1000 UT) TABS Take 1 tablet by mouth daily in the afternoon.     Current Facility-Administered Medications on File Prior to Visit  Medication Dose Route Frequency Provider Last Rate Last Admin   0.9 %  sodium chloride infusion  500 mL Intravenous Once Hilarie Fredrickson, MD        Review of Systems:  As per HPI- otherwise negative.   Physical Examination: There were no vitals filed for this visit. There were no vitals filed for this visit. There is no height or weight on file to calculate BMI. Ideal Body Weight:    GEN: no acute distress. HEENT: Atraumatic, Normocephalic.  Ears and Nose: No external deformity. CV: RRR, No M/G/R. No JVD. No thrill. No extra heart sounds. PULM: CTA B, no wheezes, crackles, rhonchi. No retractions. No resp. distress. No accessory muscle use. ABD: S, NT, ND, +BS. No rebound. No HSM. EXTR: No c/c/e PSYCH: Normally interactive. Conversant.  Foot exam  Assessment and Plan: *** Physical exam today.  Encouraged healthy diet and exercise routine Will plan further follow- up pending labs.  Signed Abbe Amsterdam, MD

## 2023-05-27 NOTE — Patient Instructions (Signed)
It was good to see you again today, I will be in touch with your labs soon as possible

## 2023-05-30 ENCOUNTER — Ambulatory Visit (INDEPENDENT_AMBULATORY_CARE_PROVIDER_SITE_OTHER): Payer: 59 | Admitting: Family Medicine

## 2023-05-30 ENCOUNTER — Other Ambulatory Visit (HOSPITAL_COMMUNITY): Payer: Self-pay

## 2023-05-30 VITALS — BP 144/80 | HR 80 | Temp 97.6°F | Resp 18 | Ht 66.0 in | Wt 190.8 lb

## 2023-05-30 DIAGNOSIS — G8929 Other chronic pain: Secondary | ICD-10-CM | POA: Diagnosis not present

## 2023-05-30 DIAGNOSIS — Z0000 Encounter for general adult medical examination without abnormal findings: Secondary | ICD-10-CM | POA: Diagnosis not present

## 2023-05-30 DIAGNOSIS — E2839 Other primary ovarian failure: Secondary | ICD-10-CM

## 2023-05-30 DIAGNOSIS — I10 Essential (primary) hypertension: Secondary | ICD-10-CM | POA: Diagnosis not present

## 2023-05-30 DIAGNOSIS — E119 Type 2 diabetes mellitus without complications: Secondary | ICD-10-CM | POA: Diagnosis not present

## 2023-05-30 DIAGNOSIS — E559 Vitamin D deficiency, unspecified: Secondary | ICD-10-CM

## 2023-05-30 DIAGNOSIS — Z1231 Encounter for screening mammogram for malignant neoplasm of breast: Secondary | ICD-10-CM

## 2023-05-30 DIAGNOSIS — Z1329 Encounter for screening for other suspected endocrine disorder: Secondary | ICD-10-CM | POA: Diagnosis not present

## 2023-05-30 DIAGNOSIS — Z13220 Encounter for screening for lipoid disorders: Secondary | ICD-10-CM

## 2023-05-30 DIAGNOSIS — M79672 Pain in left foot: Secondary | ICD-10-CM

## 2023-05-30 MED ORDER — OLMESARTAN MEDOXOMIL 40 MG PO TABS
40.0000 mg | ORAL_TABLET | Freq: Every day | ORAL | 3 refills | Status: DC
  Filled 2023-05-30: qty 90, fill #0
  Filled 2023-08-06: qty 90, 90d supply, fill #0
  Filled 2023-11-03 (×2): qty 90, 90d supply, fill #1
  Filled 2024-02-19: qty 90, 90d supply, fill #2
  Filled 2024-05-16: qty 90, 90d supply, fill #3

## 2023-05-30 MED ORDER — AMLODIPINE BESYLATE 5 MG PO TABS
10.0000 mg | ORAL_TABLET | Freq: Every day | ORAL | 3 refills | Status: DC
  Filled 2023-05-30 – 2023-06-26 (×2): qty 180, 90d supply, fill #0
  Filled 2023-10-16: qty 180, 90d supply, fill #1

## 2023-05-31 ENCOUNTER — Encounter: Payer: Self-pay | Admitting: Family Medicine

## 2023-05-31 LAB — LIPID PANEL
Cholesterol: 167 mg/dL (ref 0–200)
HDL: 50.3 mg/dL (ref >39.00)
LDL Cholesterol: 97 mg/dL (ref 0–99)
NonHDL: 116.65
Total CHOL/HDL Ratio: 3
Triglycerides: 96 mg/dL (ref 0.0–149.0)
VLDL: 19.2 mg/dL (ref 0.0–40.0)

## 2023-05-31 LAB — COMPREHENSIVE METABOLIC PANEL
ALT: 14 U/L (ref 0–35)
AST: 14 U/L (ref 0–37)
Albumin: 4.3 g/dL (ref 3.5–5.2)
Alkaline Phosphatase: 52 U/L (ref 39–117)
BUN: 24 mg/dL — ABNORMAL HIGH (ref 6–23)
CO2: 25 meq/L (ref 19–32)
Calcium: 9.4 mg/dL (ref 8.4–10.5)
Chloride: 99 meq/L (ref 96–112)
Creatinine, Ser: 1.09 mg/dL (ref 0.40–1.20)
GFR: 51.93 mL/min — ABNORMAL LOW (ref >60.00)
Glucose, Bld: 102 mg/dL — ABNORMAL HIGH (ref 70–99)
Potassium: 4.1 meq/L (ref 3.5–5.1)
Sodium: 135 meq/L (ref 135–145)
Total Bilirubin: 0.7 mg/dL (ref 0.2–1.2)
Total Protein: 6.4 g/dL (ref 6.0–8.3)

## 2023-05-31 LAB — CBC
HCT: 36.9 % (ref 36.0–46.0)
Hemoglobin: 12.5 g/dL (ref 12.0–15.0)
MCHC: 33.8 g/dL (ref 30.0–36.0)
MCV: 95.2 fl (ref 78.0–100.0)
Platelets: 190 10*3/uL (ref 150.0–400.0)
RBC: 3.87 Mil/uL (ref 3.87–5.11)
RDW: 12.6 % (ref 11.5–15.5)
WBC: 5.6 10*3/uL (ref 4.0–10.5)

## 2023-05-31 LAB — MICROALBUMIN / CREATININE URINE RATIO
Creatinine,U: 28.4 mg/dL
Microalb Creat Ratio: 2.5 mg/g (ref 0.0–30.0)
Microalb, Ur: <0.7 mg/dL (ref 0.0–1.9)

## 2023-05-31 LAB — HEMOGLOBIN A1C: Hgb A1c MFr Bld: 7 % — ABNORMAL HIGH (ref 4.6–6.5)

## 2023-05-31 LAB — TSH: TSH: 1.91 u[IU]/mL (ref 0.35–5.50)

## 2023-05-31 LAB — VITAMIN D 25 HYDROXY (VIT D DEFICIENCY, FRACTURES): VITD: 39.32 ng/mL (ref 30.00–100.00)

## 2023-06-11 ENCOUNTER — Other Ambulatory Visit (HOSPITAL_COMMUNITY): Payer: Self-pay

## 2023-06-20 DIAGNOSIS — H5203 Hypermetropia, bilateral: Secondary | ICD-10-CM | POA: Diagnosis not present

## 2023-06-26 ENCOUNTER — Other Ambulatory Visit (HOSPITAL_BASED_OUTPATIENT_CLINIC_OR_DEPARTMENT_OTHER): Payer: Self-pay

## 2023-06-27 ENCOUNTER — Other Ambulatory Visit (HOSPITAL_BASED_OUTPATIENT_CLINIC_OR_DEPARTMENT_OTHER): Payer: Self-pay

## 2023-08-06 ENCOUNTER — Other Ambulatory Visit (HOSPITAL_COMMUNITY): Payer: Self-pay

## 2023-08-21 ENCOUNTER — Ambulatory Visit
Admission: RE | Admit: 2023-08-21 | Discharge: 2023-08-21 | Disposition: A | Discharge status: Home / Self Care | Payer: 59 | Source: Ambulatory Visit | Attending: Family Medicine | Admitting: Family Medicine

## 2023-08-21 DIAGNOSIS — Z1231 Encounter for screening mammogram for malignant neoplasm of breast: Secondary | ICD-10-CM

## 2023-10-10 ENCOUNTER — Encounter (INDEPENDENT_AMBULATORY_CARE_PROVIDER_SITE_OTHER): Payer: 59 | Admitting: Family Medicine

## 2023-10-10 DIAGNOSIS — H00019 Hordeolum externum unspecified eye, unspecified eyelid: Secondary | ICD-10-CM

## 2023-10-10 NOTE — Telephone Encounter (Signed)

## 2023-10-16 ENCOUNTER — Other Ambulatory Visit (HOSPITAL_COMMUNITY): Payer: Self-pay

## 2023-10-26 ENCOUNTER — Other Ambulatory Visit (HOSPITAL_COMMUNITY): Payer: Self-pay

## 2023-10-26 ENCOUNTER — Other Ambulatory Visit: Payer: Self-pay

## 2023-10-26 MED ORDER — MAXITROL 3.5-10000-0.1 OP OINT
TOPICAL_OINTMENT | Freq: Three times a day (TID) | OPHTHALMIC | 0 refills | Status: AC
  Filled 2023-10-26: qty 3.5, 10d supply, fill #0
  Filled 2023-10-26: qty 3.5, 7d supply, fill #0

## 2023-11-03 ENCOUNTER — Other Ambulatory Visit (HOSPITAL_COMMUNITY): Payer: Self-pay

## 2023-12-14 ENCOUNTER — Encounter: Payer: Self-pay | Admitting: Family Medicine

## 2023-12-14 ENCOUNTER — Other Ambulatory Visit: Payer: Self-pay | Admitting: Family Medicine

## 2023-12-14 DIAGNOSIS — E2839 Other primary ovarian failure: Secondary | ICD-10-CM

## 2023-12-14 DIAGNOSIS — Z1231 Encounter for screening mammogram for malignant neoplasm of breast: Secondary | ICD-10-CM

## 2023-12-18 ENCOUNTER — Other Ambulatory Visit: Payer: 59

## 2023-12-18 ENCOUNTER — Other Ambulatory Visit: Payer: Self-pay

## 2024-01-22 ENCOUNTER — Other Ambulatory Visit (HOSPITAL_COMMUNITY): Payer: Self-pay

## 2024-05-14 ENCOUNTER — Other Ambulatory Visit (HOSPITAL_COMMUNITY): Payer: Self-pay

## 2024-05-31 NOTE — Progress Notes (Addendum)
 [facility], [employer] [facility] [employer] [address], [address] [place], [place] [address] [phone]  Date:  06/04/2024   Name:  Sarah Rangel   DOB:  [birth year]   MRN:  [id number]  PCP:  Watt Harlene BROCKS, MD    Chief Complaint: Annual Exam   History of Present Illness:  Sarah Rangel is a 68 y.o. very pleasant female patient who presents with the following:  Patient seen today for physical exam.  I last saw Sarah Rangel about 1 year ago, also for physical History of hypertension and diabetes. 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- she is doing 32 hours a week and plans to continue this  Former smoker, she has a 10 to 15 pack year history and as such does not qualify for lung cancer screening Occasional alcohol   Shingrix- recommend zostavax  Due for labs, A1c Flu shot- she will do at work  Recommend COVID booster Needs urine micro Needs foot exam Eye exam- she has an appt today, will ask them to send me the report  Colonoscopy up-to-date DEXA can be updated- scheduled already  Mammogram up-to-date Status post complete hysterectomy  Lab Results  Component Value Date   HGBA1C 7.0 (H) 05/30/2023    Discussed the use of AI scribe software for clinical note transcription with the patient, who gave verbal consent to proceed.  History of Present Illness Sarah Rangel is a 68 year old female who presents for routine follow-up and medication refills.  She experiences occasional lower back pain, primarily in the middle of her back, often triggered by activities such as yard work or swimming. She manages the pain with Tylenol  and avoids ibuprofen  due to concerns about its impact on her blood pressure.  Her hypertension is managed with losartan and amlodipine . She typically takes one 5 mg tablet of amlodipine  daily but occasionally takes two when experiencing  facial flushing. She is also taking vitamin D  in capsule form.  She has a history of diabetes and does not check her blood sugars at home.  She has had a complete hysterectomy and reports no issues related to this. She has had shingles twice and received the Zostavax vaccine at age 44 but has not yet received the newer shingles vaccine.  She swims regularly at the aquatic center and reports no chest pain or difficulty breathing during exercise. She also rides horses and enjoys spending time with her grandchildren. She has scheduled a mammogram and bone density scan, and has an eye appointment this afternoon. She had a colonoscopy in June of the previous year.    Patient Active Problem List   Diagnosis Date Noted   Diabetes mellitus without complication (HCC) 07/04/2016   White coat syndrome with diagnosis of hypertension 12/29/2015   Status post total abdominal hysterectomy and bilateral salpingo-oophorectomy 11/02/2015   Worsening headaches 12/05/2013   Essential hypertension 11/06/2013   Stress 11/06/2013    Past Medical History:  Diagnosis Date   Anxiety    History of cystocele    History of shingles 2015   two occurances   Hypertension    Osteopenia 2020    Past Surgical History:  Procedure Laterality Date   ABDOMINAL HYSTERECTOMY N/A 11/02/2015   Procedure: HYSTERECTOMY ABDOMINAL;  Surgeon: Bobie FORBES Cathlyn BROCKS Nikki, MD;  Location: [facility];  Service: Gynecology;  Laterality: N/A;  4 hours   ABDOMINAL SACROCOLPOPEXY N/A 11/02/2015  Procedure: ABDOMINO SACROCOLPOPEXY with Alyte mesh and Halban's Culdoplasty;  Surgeon: Bobie FORBES Cathlyn JAYSON Nikki, MD;  Location: [facility];  Service: Gynecology;  Laterality: N/A;   ANTERIOR AND POSTERIOR REPAIR N/A 11/02/2015   Procedure: POSTERIOR REPAIR (RECTOCELE) ;  Surgeon: Bobie FORBES Cathlyn JAYSON Nikki, MD;  Location: [facility];  Service: Gynecology;  Laterality: N/A;   BLADDER SUSPENSION N/A 11/02/2015   Procedure: TRANSVAGINAL TAPE (TVT) PROCEDURE  exact mid-urethral sling;  Surgeon: Bobie FORBES Cathlyn JAYSON Nikki, MD;  Location: [facility];  Service: Gynecology;  Laterality: N/A;   COLONOSCOPY     CYSTOSCOPY N/A 11/02/2015   Procedure: CYSTOSCOPY;  Surgeon: Bobie FORBES Cathlyn JAYSON Nikki, MD;  Location: [facility];  Service: Gynecology;  Laterality: N/A;   SALPINGOOPHORECTOMY Bilateral 11/02/2015   Procedure: SALPINGO OOPHORECTOMY;  Surgeon: Bobie FORBES Cathlyn JAYSON Nikki, MD;  Location: [facility];  Service: Gynecology;  Laterality: Bilateral;   WISDOM TOOTH EXTRACTION     age 63    Social History   Tobacco Use   Smoking status: Former    Types: Cigarettes   Smokeless tobacco: Never  Vaping Use   Vaping status: Never Used  Substance Use Topics   Alcohol use: Yes    Alcohol/week: 2.0 standard drinks of alcohol    Types: 2 Standard drinks or equivalent per week    Comment: occasionally 1 time monthly   Drug use: No    Family History  Problem Relation Age of Onset   Brain cancer Mother    Lymphoma Mother    Hypertension Father    Colon cancer Neg Hx    Pancreatic cancer Neg Hx    Stomach cancer Neg Hx    Colon polyps Neg Hx    Crohn's disease Neg Hx    Esophageal cancer Neg Hx    Rectal cancer Neg Hx    Ulcerative colitis Neg Hx     No Known Allergies  Medication list has been reviewed and updated.  Current Outpatient Medications on File Prior to Visit  Medication Sig Dispense Refill   docusate sodium (COLACE) 100 MG capsule Take 100 mg by mouth daily.     fluticasone  (FLONASE ) 50 MCG/ACT nasal spray Place 2 sprays into both nostrils daily. (Patient taking differently: Place 2 sprays into both nostrils as needed.) 16 g 1   neomycin -polymyxin-dexameth (MAXITROL ) 0.1 % OINT Apply a small amount to the left lower lid 3 times daily 3.5 g 0   Vitamin D , Ergocalciferol , (DRISDOL) 1.25 MG (50000 UNIT) CAPS capsule Take 50,000 Units by mouth every 7 (seven) days.     Vitamin D , Cholecalciferol, 25 MCG (1000 UT) TABS Take 1 tablet by mouth daily in  the afternoon.     No current facility-administered medications on file prior to visit.    Review of Systems:  As per HPI- otherwise negative.    Physical Examination: Vitals:   06/04/24 1300  BP: 138/84  Pulse: 63  Temp: 97.8 F (36.6 C)  SpO2: 100%   Vitals:   06/04/24 1300  Weight: 189 lb 12.8 oz (86.1 kg)   Body mass index is 30.63 kg/m. Ideal Body Weight:    GEN: no acute distress.  Mild obesity, looks well  HEENT: Atraumatic, Normocephalic.  Bilateral TM wnl, oropharynx normal.  PEERL,EOMI.   Ears and Nose: No external deformity. CV: RRR, No M/G/R. No JVD. No thrill. No extra heart sounds. PULM: CTA B, no wheezes, crackles, rhonchi. No retractions. No resp. distress. No accessory muscle use. ABD: S,  NT, ND, +BS. No rebound. No HSM. EXTR: No c/c/e PSYCH: Normally interactive. Conversant.  Foot exam normal   Assessment and Plan: Physical exam  Diabetes mellitus without complication (HCC) - Plan: Comprehensive metabolic panel with GFR, Hemoglobin A1c, Microalbumin / creatinine urine ratio  Screening for hyperlipidemia - Plan: Lipid panel  Essential hypertension - Plan: CBC, Comprehensive metabolic panel with GFR, olmesartan  (BENICAR ) 40 MG tablet, amLODipine  (NORVASC ) 5 MG tablet  White coat syndrome with diagnosis of hypertension - Plan: olmesartan  (BENICAR ) 40 MG tablet, amLODipine  (NORVASC ) 5 MG tablet  Thyroid  disorder screening - Plan: TSH, T3, free  Vitamin D  deficiency - Plan: VITAMIN D  25 Hydroxy (Vit-D Deficiency, Fractures)  Chronic midline low back pain without sciatica - Plan: methocarbamol  (ROBAXIN ) 500 MG tablet  Assessment & Plan Adult Wellness Visit Routine wellness visit with planned blood work and imaging. Colonoscopy current. - Order blood work including A1c, vitamin D , TSH, and T3. - Schedule mammogram and bone density scan for December.  Type 2 diabetes mellitus Diabetes managed without home glucose monitoring. Discussed RSV  vaccine benefits. - Order A1c test. - Recommend RSV vaccine.  Essential hypertension Hypertension managed with losartan, olmesartan , and amlodipine . Discussed ibuprofen 's impact on blood pressure. - Refill losartan. - Refill olmesartan . - Refill amlodipine  5 mg with instructions to take one daily and two as needed.  Chronic lower back pain Mild intermittent back pain managed with Tylenol . Discussed Robaxin  for additional relief. - Prescribe Robaxin . - Advise on potential drowsiness with Robaxin  use.  General Health Maintenance Discussed importance of vaccinations including new shingles vaccine and COVID booster. - Recommend new shingles vaccine. - Recommend COVID booster.  Signed Harlene Schroeder, MD  Addendum 9/19, received labs as below.  Message to patient  Results for orders placed or performed in visit on 06/04/24  CBC   Collection Time: 06/04/24  1:32 PM  Result Value Ref Range   WBC 5.8 4.0 - 10.5 K/uL   RBC 4.10 3.87 - 5.11 Mil/uL   Platelets 204.0 150.0 - 400.0 K/uL   Hemoglobin 13.1 12.0 - 15.0 g/dL   HCT 61.7 63.9 - 53.9 %   MCV 93.3 78.0 - 100.0 fl   MCHC 34.2 30.0 - 36.0 g/dL   RDW 86.9 88.4 - 84.4 %  Comprehensive metabolic panel with GFR   Collection Time: 06/04/24  1:32 PM  Result Value Ref Range   Sodium 132 (L) 135 - 145 mEq/L   Potassium 3.9 3.5 - 5.1 mEq/L   Chloride 98 96 - 112 mEq/L   CO2 23 19 - 32 mEq/L   Glucose, Bld 111 (H) 70 - 99 mg/dL   BUN 26 (H) 6 - 23 mg/dL   Creatinine, Ser 8.94 0.40 - 1.20 mg/dL   Total Bilirubin 0.8 0.2 - 1.2 mg/dL   Alkaline Phosphatase 52 39 - 117 U/L   AST 14 0 - 37 U/L   ALT 15 0 - 35 U/L   Total Protein 7.0 6.0 - 8.3 g/dL   Albumin 4.9 3.5 - 5.2 g/dL   GFR 46.06 (L) >39.99 mL/min   Calcium 9.7 8.4 - 10.5 mg/dL  Hemoglobin J8r   Collection Time: 06/04/24  1:32 PM  Result Value Ref Range   Hgb A1c MFr Bld 7.3 (H) 4.6 - 6.5 %  Lipid panel   Collection Time: 06/04/24  1:32 PM  Result Value Ref Range    Cholesterol 190 0 - 200 mg/dL   Triglycerides 877.9 0.0 - 149.0 mg/dL   HDL 49.69 >60.99  mg/dL   VLDL 75.5 0.0 - 59.9 mg/dL   LDL Cholesterol 884 (H) 0 - 99 mg/dL   Total CHOL/HDL Ratio 4    NonHDL 139.40   TSH   Collection Time: 06/04/24  1:32 PM  Result Value Ref Range   TSH 1.73 0.35 - 5.50 uIU/mL  Microalbumin / creatinine urine ratio   Collection Time: 06/04/24  1:32 PM  Result Value Ref Range   Microalb, Ur <0.7 mg/dL   Creatinine,U 77.2 mg/dL   Microalb Creat Ratio Unable to calculate 0.0 - 30.0 mg/g  VITAMIN D  25 Hydroxy (Vit-D Deficiency, Fractures)   Collection Time: 06/04/24  1:32 PM  Result Value Ref Range   VITD 59.14 30.00 - 100.00 ng/mL  T3, free   Collection Time: 06/04/24  1:32 PM  Result Value Ref Range   T3, Free 3.0 2.3 - 4.2 pg/mL    "

## 2024-05-31 NOTE — Patient Instructions (Addendum)
 It was great to see you today, I will be in touch with your labs a soon as possible Recommend flu shot this fall if not done already Recommend shingles series, Shingrix if not done already  Recommend COVID booster this fall as well as your flu shot and a dose of RSV   Please ask your eye doctor to send me a copy of your report

## 2024-06-04 ENCOUNTER — Ambulatory Visit: Payer: 59 | Admitting: Family Medicine

## 2024-06-04 ENCOUNTER — Encounter: Payer: Self-pay | Admitting: Family Medicine

## 2024-06-04 ENCOUNTER — Ambulatory Visit: Admitting: Family Medicine

## 2024-06-04 ENCOUNTER — Other Ambulatory Visit (HOSPITAL_COMMUNITY): Payer: Self-pay

## 2024-06-04 VITALS — BP 138/84 | HR 63 | Temp 97.8°F | Wt 189.8 lb

## 2024-06-04 DIAGNOSIS — Z1329 Encounter for screening for other suspected endocrine disorder: Secondary | ICD-10-CM | POA: Diagnosis not present

## 2024-06-04 DIAGNOSIS — Z0001 Encounter for general adult medical examination with abnormal findings: Secondary | ICD-10-CM | POA: Diagnosis not present

## 2024-06-04 DIAGNOSIS — E559 Vitamin D deficiency, unspecified: Secondary | ICD-10-CM | POA: Diagnosis not present

## 2024-06-04 DIAGNOSIS — E871 Hypo-osmolality and hyponatremia: Secondary | ICD-10-CM

## 2024-06-04 DIAGNOSIS — M545 Low back pain, unspecified: Secondary | ICD-10-CM

## 2024-06-04 DIAGNOSIS — I10 Essential (primary) hypertension: Secondary | ICD-10-CM

## 2024-06-04 DIAGNOSIS — Z13220 Encounter for screening for lipoid disorders: Secondary | ICD-10-CM | POA: Diagnosis not present

## 2024-06-04 DIAGNOSIS — Z0000 Encounter for general adult medical examination without abnormal findings: Secondary | ICD-10-CM

## 2024-06-04 DIAGNOSIS — E119 Type 2 diabetes mellitus without complications: Secondary | ICD-10-CM

## 2024-06-04 DIAGNOSIS — G8929 Other chronic pain: Secondary | ICD-10-CM

## 2024-06-04 LAB — MICROALBUMIN / CREATININE URINE RATIO
Creatinine,U: 22.7 mg/dL
Microalb Creat Ratio: UNDETERMINED mg/g (ref 0.0–30.0)
Microalb, Ur: <0.7 mg/dL

## 2024-06-04 MED ORDER — METHOCARBAMOL 500 MG PO TABS
500.0000 mg | ORAL_TABLET | Freq: Three times a day (TID) | ORAL | 3 refills | Status: AC | PRN
  Filled 2024-06-04: qty 30, 10d supply, fill #0
  Filled 2024-08-18: qty 30, 10d supply, fill #1

## 2024-06-04 MED ORDER — OLMESARTAN MEDOXOMIL 40 MG PO TABS
40.0000 mg | ORAL_TABLET | Freq: Every day | ORAL | 3 refills | Status: AC
  Filled 2024-06-04 – 2024-08-18 (×2): qty 90, 90d supply, fill #0

## 2024-06-04 MED ORDER — AMLODIPINE BESYLATE 5 MG PO TABS
10.0000 mg | ORAL_TABLET | Freq: Every day | ORAL | 3 refills | Status: AC
  Filled 2024-06-04: qty 180, 90d supply, fill #0

## 2024-06-04 MED ORDER — MAXITROL 3.5-10000-0.1 OP OINT
TOPICAL_OINTMENT | OPHTHALMIC | 1 refills | Status: DC
  Filled 2024-06-04: qty 3.5, 10d supply, fill #0

## 2024-06-05 ENCOUNTER — Other Ambulatory Visit (HOSPITAL_COMMUNITY): Payer: Self-pay

## 2024-06-05 LAB — HEMOGLOBIN A1C: Hgb A1c MFr Bld: 7.3 % — ABNORMAL HIGH (ref 4.6–6.5)

## 2024-06-05 LAB — LIPID PANEL
Cholesterol: 190 mg/dL (ref 0–200)
HDL: 50.3 mg/dL (ref >39.00)
LDL Cholesterol: 115 mg/dL — ABNORMAL HIGH (ref 0–99)
NonHDL: 139.4
Total CHOL/HDL Ratio: 4
Triglycerides: 122 mg/dL (ref 0.0–149.0)
VLDL: 24.4 mg/dL (ref 0.0–40.0)

## 2024-06-05 LAB — CBC
HCT: 38.2 % (ref 36.0–46.0)
Hemoglobin: 13.1 g/dL (ref 12.0–15.0)
MCHC: 34.2 g/dL (ref 30.0–36.0)
MCV: 93.3 fl (ref 78.0–100.0)
Platelets: 204 K/uL (ref 150.0–400.0)
RBC: 4.1 Mil/uL (ref 3.87–5.11)
RDW: 13 % (ref 11.5–15.5)
WBC: 5.8 K/uL (ref 4.0–10.5)

## 2024-06-05 LAB — COMPREHENSIVE METABOLIC PANEL WITH GFR
ALT: 15 U/L (ref 0–35)
AST: 14 U/L (ref 0–37)
Albumin: 4.9 g/dL (ref 3.5–5.2)
Alkaline Phosphatase: 52 U/L (ref 39–117)
BUN: 26 mg/dL — ABNORMAL HIGH (ref 6–23)
CO2: 23 meq/L (ref 19–32)
Calcium: 9.7 mg/dL (ref 8.4–10.5)
Chloride: 98 meq/L (ref 96–112)
Creatinine, Ser: 1.05 mg/dL (ref 0.40–1.20)
GFR: 53.93 mL/min — ABNORMAL LOW (ref >60.00)
Glucose, Bld: 111 mg/dL — ABNORMAL HIGH (ref 70–99)
Potassium: 3.9 meq/L (ref 3.5–5.1)
Sodium: 132 meq/L — ABNORMAL LOW (ref 135–145)
Total Bilirubin: 0.8 mg/dL (ref 0.2–1.2)
Total Protein: 7 g/dL (ref 6.0–8.3)

## 2024-06-05 LAB — TSH: TSH: 1.73 u[IU]/mL (ref 0.35–5.50)

## 2024-06-05 LAB — VITAMIN D 25 HYDROXY (VIT D DEFICIENCY, FRACTURES): VITD: 59.14 ng/mL (ref 30.00–100.00)

## 2024-06-05 LAB — T3, FREE: T3, Free: 3 pg/mL (ref 2.3–4.2)

## 2024-06-05 MED ORDER — NEOMYCIN-POLYMYXIN-DEXAMETH 0.1 % OP OINT
TOPICAL_OINTMENT | OPHTHALMIC | 1 refills | Status: AC
  Filled 2024-06-05: qty 3.5, 7d supply, fill #0

## 2024-06-06 ENCOUNTER — Encounter: Payer: Self-pay | Admitting: Family Medicine

## 2024-06-06 NOTE — Addendum Note (Signed)
 Addended by: WATT RAISIN C on: 06/06/2024 01:24 PM   Modules accepted: Orders

## 2024-06-23 ENCOUNTER — Other Ambulatory Visit (HOSPITAL_COMMUNITY): Payer: Self-pay

## 2024-08-18 ENCOUNTER — Other Ambulatory Visit (HOSPITAL_COMMUNITY): Payer: Self-pay

## 2024-08-21 ENCOUNTER — Inpatient Hospital Stay
Admission: RE | Admit: 2024-08-21 | Discharge: 2024-08-21 | Payer: Self-pay | Attending: Family Medicine | Admitting: Family Medicine

## 2024-08-21 ENCOUNTER — Other Ambulatory Visit: Payer: Self-pay

## 2024-08-21 ENCOUNTER — Ambulatory Visit: Payer: Self-pay

## 2024-08-21 DIAGNOSIS — Z1231 Encounter for screening mammogram for malignant neoplasm of breast: Secondary | ICD-10-CM

## 2024-09-04 ENCOUNTER — Telehealth: Payer: Self-pay | Admitting: Family Medicine

## 2024-09-04 NOTE — Telephone Encounter (Signed)
 Copied from CRM #[id number]. Topic: Medicare AWV >> Sep 04, 2024  9:42 AM Nathanel DEL wrote: Called LVM 09/04/2024 to sched AWVI. Please schedule AWVI in office only.   Nathanel Paschal; Care Guide Ambulatory Clinical Support [place] [facility] Direct Dial: [phone]

## 2025-06-10 ENCOUNTER — Encounter: Admitting: Family Medicine
